# Patient Record
Sex: Female | Born: 1985 | Race: White | Hispanic: No | Marital: Single | Smoking: Current every day smoker
Health system: Southern US, Community
[De-identification: ages and names within clinical notes are randomized; demographics above are authoritative.]

## PROBLEM LIST (undated history)

## (undated) DIAGNOSIS — E669 Obesity, unspecified: Secondary | ICD-10-CM

## (undated) DIAGNOSIS — F329 Major depressive disorder, single episode, unspecified: Secondary | ICD-10-CM

## (undated) DIAGNOSIS — M1990 Unspecified osteoarthritis, unspecified site: Secondary | ICD-10-CM

## (undated) DIAGNOSIS — C569 Malignant neoplasm of unspecified ovary: Secondary | ICD-10-CM

## (undated) DIAGNOSIS — F319 Bipolar disorder, unspecified: Secondary | ICD-10-CM

## (undated) DIAGNOSIS — E282 Polycystic ovarian syndrome: Secondary | ICD-10-CM

## (undated) DIAGNOSIS — K219 Gastro-esophageal reflux disease without esophagitis: Secondary | ICD-10-CM

## (undated) DIAGNOSIS — F32A Depression, unspecified: Secondary | ICD-10-CM

## (undated) HISTORY — DX: Major depressive disorder, single episode, unspecified: F32.9

## (undated) HISTORY — DX: Obesity, unspecified: E66.9

## (undated) HISTORY — DX: Depression, unspecified: F32.A

## (undated) HISTORY — DX: Gastro-esophageal reflux disease without esophagitis: K21.9

## (undated) HISTORY — DX: Unspecified osteoarthritis, unspecified site: M19.90

---
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
## 2007-01-01 ENCOUNTER — Emergency Department (HOSPITAL_COMMUNITY): Admission: EM | Admit: 2007-01-01 | Discharge: 2007-01-01 | Payer: Self-pay | Admitting: Emergency Medicine

## 2007-05-15 DIAGNOSIS — F32A Depression, unspecified: Secondary | ICD-10-CM | POA: Insufficient documentation

## 2007-12-13 HISTORY — PX: ANTERIOR CRUCIATE LIGAMENT REPAIR: SHX115

## 2008-10-10 ENCOUNTER — Other Ambulatory Visit: Admission: RE | Admit: 2008-10-10 | Discharge: 2008-10-10 | Payer: Self-pay | Admitting: Obstetrics and Gynecology

## 2008-12-30 ENCOUNTER — Encounter: Admission: RE | Admit: 2008-12-30 | Discharge: 2009-03-24 | Payer: Self-pay | Admitting: Specialist

## 2009-07-04 ENCOUNTER — Emergency Department (HOSPITAL_COMMUNITY): Admission: EM | Admit: 2009-07-04 | Discharge: 2009-07-04 | Payer: Self-pay | Admitting: Emergency Medicine

## 2009-12-30 ENCOUNTER — Other Ambulatory Visit: Admission: RE | Admit: 2009-12-30 | Discharge: 2009-12-30 | Payer: Self-pay | Admitting: Obstetrics and Gynecology

## 2010-07-01 ENCOUNTER — Other Ambulatory Visit: Admission: RE | Admit: 2010-07-01 | Discharge: 2010-07-01 | Payer: Self-pay | Admitting: Obstetrics and Gynecology

## 2011-04-29 NOTE — H&P (Signed)
NAMESHRINIKA, BLATZ              ACCOUNT NO.:  [id number]   MEDICAL RECORD NO.:  [id number]          PATIENT TYPE:  INP   LOCATION:  LDR1                          FACILITY:  [facility]   PHYSICIAN:  [name], M.D. DATE OF BIRTH:  July 09, 1986   DATE OF ADMISSION:  12/07/2005  DATE OF DISCHARGE:  LH                                HISTORY & PHYSICAL   REASON FOR ADMISSION:  Pregnancy at 40 weeks and 5 days for induction of  labor.   HISTORY OF PRESENT ILLNESS:  Veronique is a 25 year old gravida 1, para 0,  EDC is 12/02/2005 who is in today for her prenatal exam and to discuss  induction of labor.  After careful discussion with risks and benefits, she  is in agreement to proceed with induction.   MEDICAL HISTORY:  Positive for migraines and psoriasis.   SURGICAL HISTORY:  Negative.   ALLERGIES:  She has no known allergies.   MEDICATIONS:  She is taking prenatal vitamins.   SOCIAL HISTORY:  The boyfriend is present and supportive.   FAMILY HISTORY:  Positive for breast cancer, hypertension, and diabetes.   PRENATAL COURSE:  Essentially uneventful.  Blood type is A positive, UDS  negative.  Rubella immune.  Hepatitis B surface antigen is negative.  HIV is  negative.  HSV type 1 positive, type 2 negative.  Serology is nonreactive on  both draws.  Pap is normal.  GC and Chlamydia are negative.  Repeat GC and  Chlamydia are also negative.  AFP normal.  Current hemoglobin 11.12, current  hematocrit 36.6.  One-hour glucose was 79, GBS is negative.   PHYSICAL EXAMINATION:  VITAL SIGNS:  Today, weight is 265, blood pressure  124/78.  Urine is clear.  ABDOMEN:  There is good fetal movement noted.  Fundal height is 40 cm.  Fetal heart rate is 150 strong and  regular.  PELVIC:  Cervix is 1-2, 70, minus 2 station.   PLAN:  We are going to admit for dilation and induction of labor.      Zerita Boers, Lanier Clam      [name], M.D.  Electronically Signed    DL/MEDQ  D:   14/78/2956  T:  12/07/2005  Job:  [id number]   cc:   Francoise Schaumann. Milford Cage DO, FAAP  Fax: [phone]

## 2011-04-29 NOTE — Op Note (Signed)
NAMENAVY, ROTHSCHILD              ACCOUNT NO.:  [id number]   MEDICAL RECORD NO.:  [id number]          PATIENT TYPE:  INP   LOCATION:  A412                          FACILITY:  [facility]   PHYSICIAN:  [name], M.D. DATE OF BIRTH:  1986-02-28   DATE OF PROCEDURE:  12/08/2005  DATE OF DISCHARGE:                                 OPERATIVE REPORT   PROCEDURE:  Epidural catheter placement, 2:30 p.m.   SURGEON:  [name], M.D.   DESCRIPTION OF PROCEDURE:  Continuous lumbar epidural catheter placed using  a loss of resistance technique at L3-4 interspace.  The first two attempts  were slightly off midline due to the patient's significant abdominal girth  and edematous tissues in the back precluding easy identification of bony  interspaces.  The patient had the characteristic loss of resistance  sensation at 8 cm depth, at which time the 5 mL test dose of 1.5% lidocaine  with epinephrine was instilled followed by insertion of the catheter 2-3 cm  into the epidural space and taping to the back.  The 9 mL bolus followed.  The patient had symmetric analgesic effect at T10 with the patient having no  blood pressure changes or changes in the fetal status.  The patient will be  maintained on 12 mL per hour and at 4 p.m. is reported as being 6 cm dilated  with the vertex descending nicely.      [name], M.D.  Electronically Signed     JVF/MEDQ  D:  12/08/2005  T:  12/09/2005  Job:  [id number]

## 2011-04-29 NOTE — Consult Note (Signed)
NAMEJAIDENCE, GEISLER NO.:  [id number]   MEDICAL RECORD NO.:  [id number]          PATIENT TYPE:  INP   LOCATION:  LDR1                          FACILITY:  [facility]   PHYSICIAN:  [name], M.D. DATE OF BIRTH:  [birth year]   DATE OF CONSULTATION:  12/08/2005  DATE OF DISCHARGE:                                   CONSULTATION   DELIVERY NOTE   I came to labor and delivery where Kaneshia had just been examined and  noted to be 10 cm and crowning.  She pushed about 3-4 times and had a  spontaneous vaginal delivery of a viable female infant at 1818 hours.  The  shoulders delivered without difficult.  Weight is 8 pounds 6 ounces.  Apgar's are 9 and 9.  She had 20 units of Pitocin diluted in 1000 mL of  lactated Ringer's was infused rapidly IV.  The placenta separated  spontaneously and delivered via controlled cord contraction at 1823 hours.  It was inspected and appears to be intact with a succenturiate.  The lochia  was normal.  The fundus was firm.  The vagina was then inspected and no  lacerations were found.  Estimated blood loss 200 mL.  The epidural catheter  was then removed with the blue-tip visualized as being intact.      Jacklyn Shell, C.N.M.      [name], M.D.  Electronically Signed    FC/MEDQ  D:  12/08/2005  T:  12/08/2005  Job:  [id number]   cc:   Jeoffrey Massed, MD  Fax: [phone]   [facility]

## 2011-08-03 ENCOUNTER — Other Ambulatory Visit (HOSPITAL_COMMUNITY)
Admission: RE | Admit: 2011-08-03 | Discharge: 2011-08-03 | Disposition: A | Discharge status: Home / Self Care | Payer: No Typology Code available for payment source | Source: Ambulatory Visit | Attending: Obstetrics and Gynecology | Admitting: Obstetrics and Gynecology

## 2011-08-03 ENCOUNTER — Other Ambulatory Visit: Payer: Self-pay | Admitting: Obstetrics and Gynecology

## 2011-08-03 DIAGNOSIS — R87810 Cervical high risk human papillomavirus (HPV) DNA test positive: Secondary | ICD-10-CM | POA: Insufficient documentation

## 2011-08-03 DIAGNOSIS — Z124 Encounter for screening for malignant neoplasm of cervix: Secondary | ICD-10-CM | POA: Insufficient documentation

## 2011-08-03 DIAGNOSIS — Z113 Encounter for screening for infections with a predominantly sexual mode of transmission: Secondary | ICD-10-CM | POA: Insufficient documentation

## 2011-08-08 ENCOUNTER — Ambulatory Visit: Payer: Self-pay | Admitting: Internal Medicine

## 2011-09-08 ENCOUNTER — Ambulatory Visit (INDEPENDENT_AMBULATORY_CARE_PROVIDER_SITE_OTHER)
Admission: RE | Admit: 2011-09-08 | Discharge: 2011-09-08 | Disposition: A | Discharge status: Home / Self Care | Payer: No Typology Code available for payment source | Source: Ambulatory Visit | Attending: Internal Medicine | Admitting: Internal Medicine

## 2011-09-08 ENCOUNTER — Other Ambulatory Visit: Payer: Self-pay | Admitting: Internal Medicine

## 2011-09-08 ENCOUNTER — Ambulatory Visit (INDEPENDENT_AMBULATORY_CARE_PROVIDER_SITE_OTHER): Payer: No Typology Code available for payment source | Admitting: Internal Medicine

## 2011-09-08 ENCOUNTER — Encounter: Payer: Self-pay | Admitting: Internal Medicine

## 2011-09-08 ENCOUNTER — Other Ambulatory Visit (INDEPENDENT_AMBULATORY_CARE_PROVIDER_SITE_OTHER): Payer: No Typology Code available for payment source

## 2011-09-08 DIAGNOSIS — R110 Nausea: Secondary | ICD-10-CM | POA: Insufficient documentation

## 2011-09-08 DIAGNOSIS — M549 Dorsalgia, unspecified: Secondary | ICD-10-CM

## 2011-09-08 DIAGNOSIS — G8929 Other chronic pain: Secondary | ICD-10-CM

## 2011-09-08 DIAGNOSIS — R42 Dizziness and giddiness: Secondary | ICD-10-CM

## 2011-09-08 DIAGNOSIS — F17200 Nicotine dependence, unspecified, uncomplicated: Secondary | ICD-10-CM

## 2011-09-08 DIAGNOSIS — E663 Overweight: Secondary | ICD-10-CM | POA: Insufficient documentation

## 2011-09-08 LAB — URINALYSIS, ROUTINE W REFLEX MICROSCOPIC
Specific Gravity, Urine: >=1.03 (ref 1.000–1.030)
pH: 5.5 (ref 5.0–8.0)

## 2011-09-08 LAB — CBC WITH DIFFERENTIAL/PLATELET
Basophils Relative: 0.4 % (ref 0.0–3.0)
Lymphocytes Relative: 26.9 % (ref 12.0–46.0)
Lymphs Abs: 3.1 10*3/uL (ref 0.7–4.0)
Monocytes Absolute: 1 10*3/uL (ref 0.1–1.0)
Monocytes Relative: 8.9 % (ref 3.0–12.0)
Neutro Abs: 7 10*3/uL (ref 1.4–7.7)
Neutrophils Relative %: 61.4 % (ref 43.0–77.0)
Platelets: 303 10*3/uL (ref 150.0–400.0)
RDW: 13.2 % (ref 11.5–14.6)

## 2011-09-08 LAB — POCT URINE PREGNANCY: Preg Test, Ur: NEGATIVE

## 2011-09-08 MED ORDER — CYCLOBENZAPRINE HCL 5 MG PO TABS: 5.0000 mg | ORAL_TABLET | Freq: Three times a day (TID) | ORAL | Status: DC | PRN

## 2011-09-08 MED ORDER — MELOXICAM 15 MG PO TABS: 15.0000 mg | ORAL_TABLET | Freq: Every day | ORAL | Status: DC

## 2011-09-08 NOTE — Progress Notes (Signed)
Subjective:    Patient ID: Jacqueline Morrow, female    DOB: 1986-11-16, 25 y.o.   MRN: [id number]  HPI New pt to me and our practice, here to establish care  complains of low back pain Onset >12 months ago Pain is intermittent, wax/wane intensity Denies precipitating overuse, injury or trauma, no falls or MVA No radiation of pain into flank, legs or up spine No weight changes - 250s# since 2006 birth of son (prev in 210s#) No dysuria, no hx kidney stones Pain not improved with OTC NSAIDs, tylenol or heat Pain currently 3/10, at worst 9/10 ?hx scoliosis as teenager in HS  Also complains of nausea symptoms worst when hungry symptoms improved with food Problem occurs daily for last 6 months associated with sweating and weakness sensation, head "foggy" and tremulous No known DM or gestational DM Not associated with abdominal pain, vomiting or bowel change  Past Medical History  Diagnosis Date  . Depression   . Arthritis   . GERD (gastroesophageal reflux disease)   . Allergic rhinitis   . Migraine   . Vaginal delivery 2006  . Obese    Family History  Problem Relation Age of Onset  . Arthritis Other     both parents & grandparents  . Heart disease Other     grandparents  . Diabetes Paternal Aunt   . Hyperlipidemia Other     grandparents  . Hypertension Maternal Grandmother    History  Substance Use Topics  . Smoking status: Current Everyday Smoker -- 1.0 packs/day    Types: Cigarettes  . Smokeless tobacco: Never Used  . Alcohol Use: 0.5 oz/week    1 drink(s) per week    Review of Systems Constitutional: Negative for fever or weight changes.  Respiratory: Negative for cough and shortness of breath.   Cardiovascular: Negative for chest pain.  Gastrointestinal: Negative for abdominal pain.  Musculoskeletal: Negative for gait problem.  Skin: Negative for rash.  Neurological: Negative for dizziness.  No other specific complaints in a complete review of systems  (except as listed in HPI above).     Objective:   Physical Exam BP 120/86  Pulse 70  Temp(Src) 98 F (36.7 C) (Oral)  Ht 5\' 9"  (1.753 m)  Wt 269 lb 6.4 oz (122.199 kg)  BMI 39.78 kg/m2  SpO2 97% Constitutional: She is overweight; She appears well-developed and well-nourished. No distress.  HENT: Head: Normocephalic and atraumatic. Ears: B TMs ok, no erythema or effusion; Nose: Nose normal.  Mouth/Throat: poor dentition; Oropharynx is red but clear and moist. No oropharyngeal exudate.  Eyes: Conjunctivae and EOM are normal. Pupils are equal, round, and reactive to light. No scleral icterus.  Neck: Thick; Normal range of motion. Neck supple. No JVD present. No thyromegaly present.  Cardiovascular: Normal rate, regular rhythm and normal heart sounds.  No murmur heard. No BLE edema. Pulmonary/Chest: Effort normal and breath sounds normal. No respiratory distress. She has no wheezes.  Abdominal: Obese, Soft. Bowel sounds are normal. She exhibits no distension. There is no tenderness. no masses Musculoskeletal: Back: full range of motion of thoracic and lumbar spine. Non tender to palpation. Negative straight leg raise. DTR's are symmetrically intact. Sensation intact in all dermatomes of the lower extremities. Full strength to manual muscle testing - patient is able to heel toe walk without difficulty and ambulates with normal gait. Neurological: She is alert and oriented to person, place, and time. No cranial nerve deficit. Coordination normal.  Skin: Skin is warm and dry. No  rash noted. No erythema.  Psychiatric: She has a normal mood and affect. Her behavior is normal. Judgment and thought content normal.   No results found for this basename: WBC, HGB, HCT, PLT, CHOL, TRIG, HDL, LDLDIRECT, ALT, AST, NA, K, CL, CREATININE, BUN, CO2, TSH, PSA, INR, GLUF, HGBA1C, MICROALBUR       Assessment & Plan:  See problem list. Medications and labs reviewed today.

## 2011-09-08 NOTE — Assessment & Plan Note (Signed)
5 minutes today spent counseling patient on unhealthy effects of continued tobacco abuse and encouragement of cessation including medical options available to help the patient quit smoking. 

## 2011-09-08 NOTE — Assessment & Plan Note (Signed)
?  reactive hypoglycemia or gastroparesis Exam benign - check screening labs Consider nutrition or GI eval if persisitng symptoms without other findings

## 2011-09-08 NOTE — Patient Instructions (Addendum)
It was good to see you today. We have reviewed your prior records including labs and tests today Test(s) ordered today - blood and xray. Your results will be called to you after review (48-72hours after test completion). If any changes need to be made, you will be notified at that time. Depending on these results, we will prescribe medications for your back pain - also consider physical therapy Work on lifestyle changes as discussed (low fat, low carb, increased protein diet; improved exercise efforts; weight loss) to control sugar, blood pressure and cholesterol levels and/or reduce risk of developing other medical problems. Look into [employer]cy or other type of food journal to assist you in this process. Stop smoking - this is very bad for you and your loved ones health! Back Exercises Back exercises help treat and prevent back injuries. The goal of back exercises is to increase the strength of your abdominal and back muscles and the flexibility of your back. These exercises should be started when you no longer have back pain. Back exercises include: 1. Pelvic Tilt - Lie on your back with your knees bent. Tilt your pelvis until the lower part of your back is against the floor. Hold this position 5-10 sec and repeat 5-10 times.  2. Knee to Chest - Pull first one knee up against your chest and hold for 20-30 seconds, repeat this with the other knee, and then both knees. This may be done with the other leg straight or bent, whichever feels better.  3. Sit-Ups or Curl-Ups - Bend your knees 90 degrees. Start with tilting your pelvis, and do a partial, slow sit-up, lifting your trunk only 30-45 degrees off the floor. Take at least 2-3 sec for each sit-up. Do not do sit-ups with your knees out straight. If partial sit-ups are difficult, simply do the above but with only tightening your abdominal muscles and holding it as directed.  4. Hip-Lift - Lie on your back with your knees flexed 90 degrees. Push  down with your feet and shoulders as you raise your hips a couple inches off the floor; hold for 10 sec, repeat 5-10 times.  5. Back arches - Lie on your stomach, propping yourself up on bent elbows. Slowly press on your hands, causing an arch in your low back. Repeat 3-5 times. Any initial stiffness and discomfort should lessen with repetition over time.  6. Shoulder-Lifts - Lie face down with arms beside your body. Keep hips and torso pressed to floor as you slowly lift your head and shoulders off the floor.  Do not overdo your exercises, especially in the beginning. Exercises may cause you some mild back discomfort which lasts for a few minutes; however, if the pain is more severe, or lasts for more than 15 minutes, do not continue exercises until you see your caregiver. Improvement with exercise therapy for back problems is slow.   See your caregivers for assistance with developing a proper back exercise program. Document Released: 01/05/2005 Document Re-Released: 02/24/2009 [facility] Patient Information 2011 [place], [place].

## 2011-09-08 NOTE — Assessment & Plan Note (Signed)
Wt Readings from Last 3 Encounters:  09/08/11 269 lb 6.4 oz (122.199 kg)   Weight is primary obstacle to health at this time Reviewed potential health risks including diabetes mellitus, dyslipidemia, HTN and osteoarthritis  Encouraged healthy lifestyle changes to include increase aerobic activity and heart healthy choices

## 2011-09-08 NOTE — Assessment & Plan Note (Signed)
No neuro deficits or Mskel abnormalities on exam Suspect strain from obesity and deconditioning Check plain films due to ?of scoliosis but not readily appreciable on exam -  Explained risk of premature DDD and DJD related to obesity If xray without significant findings, plan NSAIDs daily and muscle relaxers prn Back exercises for core strengthening and reassurance provided

## 2011-09-09 ENCOUNTER — Other Ambulatory Visit: Payer: Self-pay | Admitting: Internal Medicine

## 2011-09-09 DIAGNOSIS — R1084 Generalized abdominal pain: Secondary | ICD-10-CM

## 2011-09-09 DIAGNOSIS — R110 Nausea: Secondary | ICD-10-CM

## 2011-09-09 LAB — BASIC METABOLIC PANEL
Calcium: 9.1 mg/dL (ref 8.4–10.5)
Chloride: 110 mEq/L (ref 96–112)
Glucose, Bld: 71 mg/dL (ref 70–99)

## 2011-09-09 LAB — HEPATIC FUNCTION PANEL
ALT: 59 U/L — ABNORMAL HIGH (ref 0–35)
Albumin: 4 g/dL (ref 3.5–5.2)
Alkaline Phosphatase: 60 U/L (ref 39–117)
Total Bilirubin: 0.4 mg/dL (ref 0.3–1.2)
Total Protein: 7.5 g/dL (ref 6.0–8.3)

## 2011-09-09 LAB — TSH: TSH: 1.23 u[IU]/mL (ref 0.35–5.50)

## 2011-09-15 ENCOUNTER — Other Ambulatory Visit: Payer: Self-pay | Admitting: Internal Medicine

## 2011-09-15 ENCOUNTER — Ambulatory Visit
Admission: RE | Admit: 2011-09-15 | Discharge: 2011-09-15 | Disposition: A | Discharge status: Home / Self Care | Payer: No Typology Code available for payment source | Source: Ambulatory Visit | Attending: Internal Medicine | Admitting: Internal Medicine

## 2011-09-15 DIAGNOSIS — K8020 Calculus of gallbladder without cholecystitis without obstruction: Secondary | ICD-10-CM | POA: Insufficient documentation

## 2011-09-15 DIAGNOSIS — R110 Nausea: Secondary | ICD-10-CM

## 2011-09-15 DIAGNOSIS — R1084 Generalized abdominal pain: Secondary | ICD-10-CM

## 2011-10-05 ENCOUNTER — Other Ambulatory Visit (HOSPITAL_COMMUNITY): Payer: No Typology Code available for payment source

## 2011-10-05 ENCOUNTER — Inpatient Hospital Stay (HOSPITAL_COMMUNITY): Admission: RE | Admit: 2011-10-05 | Payer: No Typology Code available for payment source | Source: Ambulatory Visit

## 2011-10-17 ENCOUNTER — Other Ambulatory Visit: Payer: Self-pay | Admitting: Obstetrics and Gynecology

## 2011-10-17 NOTE — H&P (Signed)
Jacqueline Morrow is an 25 y.o. female.  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 Previous biopsies in 2011 had only identified and mild dysplasia. But August 2012 Pap smear showed progression of disease   patient desires future childbearing to Pertinent Gynecological History: Menses: flow is moderate and regular every month without intermenstrual spotting Bleeding:  normal Contraception: none DES exposure: denies Blood transfusions: none Sexually transmitted diseases: no past history Previous GYN Procedures: colposcopy with biopsies 2011 Last mammogram:  not applicable Date:  Last pap: abnormal:  high-grade lesion Date:  August 2012 OB History: G1, P1   Menstrual History: Menarche age:  No LMP recorded.    Past Medical History  Diagnosis Date  . Depression   . Arthritis   . GERD (gastroesophageal reflux disease)   . Allergic rhinitis   . Migraine   . Vaginal delivery 2006  . Obese     Past Surgical History  Procedure Date  . Anterior cruciate ligament repair 2009    Family History  Problem Relation Age of Onset  . Arthritis Other     both parents & grandparents  . Heart disease Other     grandparents  . Diabetes Paternal Aunt   . Hyperlipidemia Other     grandparents  . Hypertension Maternal Grandmother     Social History:  reports that she has been smoking Cigarettes.  She has been smoking about 1 pack per day. She has never used smokeless tobacco. She reports that she drinks about .5 ounces of alcohol per week. She reports that she does not use illicit drugs.  Allergies: No Known Allergies   (Not in a hospital admission)  ROS  Negative except for history of present illness  There were no vitals taken for this visit. Physical ExamPhysical Examination: General appearance -  alert, well appearing, and in no distress and oriented to person, place, and time Mouth - mucous membranes moist, pharynx normal without lesions and dental hygiene good Chest - clear to auscultation, no wheezes, rales or rhonchi, symmetric air entry Heart - normal rate and regular rhythm Abdomen - soft, nontender, nondistended, no masses or organomegaly Pelvic - VULVA: normal appearing vulva with no masses, tenderness or lesions, VAGINA: normal appearing vagina with normal color and discharge, no lesions, CERVIX: normal appearing cervix without discharge or lesions, ectropion   cervix with extensive  Dysplasia of suspected grade 2 UTERUS: uterus is normal size, shape, consistency and nontender  No results found for this or any previous visit (from the past 24 hour(s)).  No results found.  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

## 2011-10-18 ENCOUNTER — Encounter (HOSPITAL_COMMUNITY)
Admission: RE | Admit: 2011-10-18 | Discharge: 2011-10-18 | Payer: No Typology Code available for payment source | Source: Ambulatory Visit | Attending: Internal Medicine | Admitting: Internal Medicine

## 2011-10-18 NOTE — Patient Instructions (Addendum)
20 Jacqueline Morrow  10/18/2011   Your procedure is scheduled on:  10/25/2011   Report to [facility] at 10:00 AM.  Call this number if you have problems the morning of surgery: [phone]   Remember:   Do not eat food:After Midnight.  Do not drink clear liquids: After Midnight.  Take these medicines the morning of surgery with A SIP OF WATER: Flexeril, Mobic   Do not wear jewelry, make-up or nail polish.  Do not wear lotions, powders, or perfumes. You may wear deodorant.  Do not shave 48 hours prior to surgery.  Do not bring valuables to the hospital.  Contacts, dentures or bridgework may not be worn into surgery.  Leave suitcase in the car. After surgery it may be brought to your room.  For patients admitted to the hospital, checkout time is 11:00 AM the day of discharge.   Patients discharged the day of surgery will not be allowed to drive home.  Name and phone number of your driver:   Special Instructions: CHG Shower Use Special Wash: 1/2 bottle night before surgery and 1/2 bottle morning of surgery.   Please read over the following fact sheets that you were given: Pain Booklet, MRSA Information, Surgical Site Infection Prevention, Anesthesia Post-op Instructions and Care and Recovery After Surgery      Conization of the Cervix Conization is the cutting (excision) of a cone-shaped portion of the cervix. This procedure is usually done when there is abnormal bleeding from the cervix. It can also be done to evaluate an abnormal Pap smear or if an abnormality is seen on the cervix during an exam. Conization of the cervix is not done during a menstrual period or pregnancy. BEFORE THE PROCEDURE  Do not eat or drink anything for 6 to 8 hours before the procedure, especially if you are going to be given a drug to make you sleep (general anesthetic).   Do not take aspirin or blood thinners for at least a week before the procedure, or as directed.   Arrive at least an hour before the  procedure to read and sign the necessary forms.   Arrange for someone to take you home after the procedure.   If you smoke, do not smoke for 2 weeks before the procedure.  LET YOUR CAREGIVER KNOW THE FOLLOWING:  Allergies to food or medications.   All the medications you are taking including over-the-counter and prescription medications, herbs, eyedrops, and creams.   You develop a cold or an infection.   If you are using illegal drugs or drinking too much alcohol.   Your smoking habits.   Previous problems with anesthetics including novocaine.   The possibility of being pregnant.   History of blood clots or other bleeding problems.   Other medical problems.  RISKS AND COMPLICATIONS   Bleeding.   Infection.   Damage to the cervix.   Injury to surrounding organs.   Problems with the anesthesia.  PROCEDURE Conization of the cervix can be done by:  Cold knife. This type cuts out the cervical canal and the transformation zone (where the normal cells end and the abnormal cells begin) with a scalpel.   LEEP (electrocautery). This type is done with a thin wire that can cut and burn (cauterize) the cervical tissue with an electrical current.   Laser treatment. This type cuts and burns (cauterizes) the tissue of the cervix to prevent bleeding with a laser beam.  You will be given a drug to make you  sleep (general anesthetic) for cold knife and laser treatments, and you will be given a numbing medication (local anesthetic) for the LEEP procedure. Conization is usually done using colposcopy. Colposcopy magnifies the cervix to see it more clearly. The tissue removed is examined under a microscope by a doctor Psychologist, educational). The pathologist will provide a report to your caregiver. This will help your caregiver decide if further treatment is necessary. This report will also help your caregiver decide on the best treatment if your results are abnormal.  AFTER THE PROCEDURE  If you had  a general anesthetic, you may be groggy for a couple of hours after the procedure.   If you had a local anesthetic, you will be advised to rest at the surgical center or caregiver's office until you are stable and feel ready to go home.   Have someone take you home.   You may have some cramping for a couple of days.   You may have a bloody discharge or light bleeding for 2 to 4 weeks.   You may have a black discharge coming from the vagina. This is from the paste used on the cervix to prevent bleeding. This is normal discharge.  HOME CARE INSTRUCTIONS   Do not drive for 24 hours.   Avoid strenuous activities and exercises for at least 7 - 10 days.   Only take over-the-counter or prescription medicines for pain, discomfort, or fever as directed by your caregiver.   Do not take aspirin. It can cause or aggravate bleeding.   You may resume your usual diet.   Drink 6 to 8 glasses of fluid a day.   Rest and sleep the first 24 to 48 hours.   Take showers instead of baths until your caregiver gives you the okay.   Do not use tampons, douche or have intercourse for 4 weeks, or as advised by your caregiver.   Do not lift anything over 10 pounds (4.5 kg) for at least 7 to 10 days, or as advised by your caregiver.   Take your temperature twice a day, for 4 to 5 days. Write them down.   Do not drink or drive when taking pain medication.   If you develop constipation:   Take a mild laxative with the advice of your caregiver.   Eat bran foods.   Drink a lot of fluids.   Try to have someone with you or available for you the first 24 to 48 hours, especially if you had a general anesthetic.   Make sure you and your family understand everything about your surgery and recovery.   Follow your caregiver's advice regarding follow-up appointments and Pap smears.  SEEK MEDICAL CARE IF:   You develop a rash.   You are dizzy or light-headed.   You feel sick to your stomach (nauseous).     You develop a bad smelling vaginal discharge.   You have an abnormal reaction or allergy to your medication.   You need stronger pain medication.  SEEK IMMEDIATE MEDICAL CARE IF:   You have blood clots or bleeding that is heavier than a normal menstrual period, or you develop bright red bleeding.   An oral temperature above 102 F (38.9 C) develops and persists for several hours.   You have increasing cramps.   You pass out.   You have painful or bloody urine.   You start throwing up (vomiting).   The pain is not relieved with your pain medication.  Not all test results  are available during your visit. If your test results are not back during your the visit, make an appointment with your caregiver to find out the results. Do not assume everything is normal if you have not heard from your caregiver or the medical facility. It is important for you to follow up on all of your test results. Document Released: 09/07/2005 Document Revised: 08/10/2011 Document Reviewed: 06/29/2009 [facility] Patient Information 2012 [place], [place].     PATIENT INSTRUCTIONS POST-ANESTHESIA  IMMEDIATELY FOLLOWING SURGERY:  Do not drive or operate machinery for the first twenty four hours after surgery.  Do not make any important decisions for twenty four hours after surgery or while taking narcotic pain medications or sedatives.  If you develop intractable nausea and vomiting or a severe headache please notify your doctor immediately.  FOLLOW-UP:  Please make an appointment with your surgeon as instructed. You do not need to follow up with anesthesia unless specifically instructed to do so.  WOUND CARE INSTRUCTIONS (if applicable):  Keep a dry clean dressing on the anesthesia/puncture wound site if there is drainage.  Once the wound has quit draining you may leave it open to air.  Generally you should leave the bandage intact for twenty four hours unless there is drainage.  If the epidural site drains  for more than 36-48 hours please call the anesthesia department.  QUESTIONS?:  Please feel free to call your physician or the hospital operator if you have any questions, and they will be happy to assist you.     [facility] Anesthesia Department [address] [place] [phone]

## 2011-10-24 ENCOUNTER — Encounter (HOSPITAL_COMMUNITY): Admission: RE | Admit: 2011-10-24 | Payer: No Typology Code available for payment source | Source: Ambulatory Visit

## 2011-10-25 ENCOUNTER — Ambulatory Visit (HOSPITAL_COMMUNITY)
Admission: RE | Admit: 2011-10-25 | Payer: No Typology Code available for payment source | Source: Ambulatory Visit | Admitting: Obstetrics and Gynecology

## 2011-10-25 ENCOUNTER — Encounter (HOSPITAL_COMMUNITY): Admission: RE | Payer: Self-pay | Source: Ambulatory Visit

## 2011-10-25 SURGERY — CONE BIOPSY, CERVIX
Anesthesia: General

## 2011-11-02 ENCOUNTER — Encounter (HOSPITAL_COMMUNITY): Payer: Self-pay

## 2011-11-02 ENCOUNTER — Encounter (HOSPITAL_COMMUNITY): Payer: Self-pay | Admitting: Pharmacy Technician

## 2011-11-02 ENCOUNTER — Encounter (HOSPITAL_COMMUNITY)
Admission: RE | Admit: 2011-11-02 | Discharge: 2011-11-02 | Disposition: A | Discharge status: Home / Self Care | Payer: No Typology Code available for payment source | Source: Ambulatory Visit | Attending: Obstetrics and Gynecology | Admitting: Obstetrics and Gynecology

## 2011-11-02 LAB — CBC
MCH: 30.5 pg (ref 26.0–34.0)
Platelets: 254 10*3/uL (ref 150–400)
RBC: 4.88 MIL/uL (ref 3.87–5.11)
WBC: 11 10*3/uL — ABNORMAL HIGH (ref 4.0–10.5)

## 2011-11-02 LAB — URINALYSIS, ROUTINE W REFLEX MICROSCOPIC
Bilirubin Urine: NEGATIVE
Nitrite: NEGATIVE
Specific Gravity, Urine: >1.03 — ABNORMAL HIGH (ref 1.005–1.030)
Urobilinogen, UA: 0.2 mg/dL (ref 0.0–1.0)

## 2011-11-02 LAB — SURGICAL PCR SCREEN: Staphylococcus aureus: NEGATIVE

## 2011-11-02 LAB — BASIC METABOLIC PANEL
BUN: 9 mg/dL (ref 6–23)
Chloride: 107 mEq/L (ref 96–112)
Creatinine, Ser: 0.79 mg/dL (ref 0.50–1.10)
GFR calc Af Amer: >90 mL/min (ref >90)
GFR calc non Af Amer: >90 mL/min (ref >90)
Glucose, Bld: 101 mg/dL — ABNORMAL HIGH (ref 70–99)

## 2011-11-02 LAB — HCG, SERUM, QUALITATIVE: Preg, Serum: NEGATIVE

## 2011-11-02 NOTE — Patient Instructions (Addendum)
21 Jacqueline Morrow  11/02/2011   Your procedure is scheduled on:  11/08/2011  Report to [facility] at 08:00 AM.  Call this number if you have problems the morning of surgery: [phone]   Remember:   Do not eat food:After Midnight.  Do not drink clear liquids: After Midnight.  Take these medicines the morning of surgery with A SIP OF WATER: Flexeril   Do not wear jewelry, make-up or nail polish.  Do not wear lotions, powders, or perfumes. You may wear deodorant.  Do not shave 48 hours prior to surgery.  Do not bring valuables to the hospital.  Contacts, dentures or bridgework may not be worn into surgery.  Leave suitcase in the car. After surgery it may be brought to your room.  For patients admitted to the hospital, checkout time is 11:00 AM the day of discharge.   Patients discharged the day of surgery will not be allowed to drive home.  Name and phone number of your driver: boyfriend, Weston Brass  Special Instructions: CHG Bath-1/2 bottle the night before surgery and 1/2 bottle the morning of surgery   Please read over the following fact sheets that you were given: Pain Booklet, MRSA Information, Surgical Site Infection Prevention, Anesthesia Post-op Instructions and Care and Recovery After Surgery      Conization of the Cervix Conization is the cutting (excision) of a cone-shaped portion of the cervix. This procedure is usually done when there is abnormal bleeding from the cervix. It can also be done to evaluate an abnormal Pap smear or if an abnormality is seen on the cervix during an exam. Conization of the cervix is not done during a menstrual period or pregnancy. BEFORE THE PROCEDURE  Do not eat or drink anything for 6 to 8 hours before the procedure, especially if you are going to be given a drug to make you sleep (general anesthetic).   Do not take aspirin or blood thinners for at least a week before the procedure, or as directed.   Arrive at least an hour before the procedure  to read and sign the necessary forms.   Arrange for someone to take you home after the procedure.   If you smoke, do not smoke for 2 weeks before the procedure.  LET YOUR CAREGIVER KNOW THE FOLLOWING:  Allergies to food or medications.   All the medications you are taking including over-the-counter and prescription medications, herbs, eyedrops, and creams.   You develop a cold or an infection.   If you are using illegal drugs or drinking too much alcohol.   Your smoking habits.   Previous problems with anesthetics including novocaine.   The possibility of being pregnant.   History of blood clots or other bleeding problems.   Other medical problems.  RISKS AND COMPLICATIONS   Bleeding.   Infection.   Damage to the cervix.   Injury to surrounding organs.   Problems with the anesthesia.  PROCEDURE Conization of the cervix can be done by:  Cold knife. This type cuts out the cervical canal and the transformation zone (where the normal cells end and the abnormal cells begin) with a scalpel.   LEEP (electrocautery). This type is done with a thin wire that can cut and burn (cauterize) the cervical tissue with an electrical current.   Laser treatment. This type cuts and burns (cauterizes) the tissue of the cervix to prevent bleeding with a laser beam.  You will be given a drug to make you sleep (general anesthetic)  for cold knife and laser treatments, and you will be given a numbing medication (local anesthetic) for the LEEP procedure. Conization is usually done using colposcopy. Colposcopy magnifies the cervix to see it more clearly. The tissue removed is examined under a microscope by a doctor Psychologist, educational). The pathologist will provide a report to your caregiver. This will help your caregiver decide if further treatment is necessary. This report will also help your caregiver decide on the best treatment if your results are abnormal.  AFTER THE PROCEDURE  If you had a general  anesthetic, you may be groggy for a couple of hours after the procedure.   If you had a local anesthetic, you will be advised to rest at the surgical center or caregiver's office until you are stable and feel ready to go home.   Have someone take you home.   You may have some cramping for a couple of days.   You may have a bloody discharge or light bleeding for 2 to 4 weeks.   You may have a black discharge coming from the vagina. This is from the paste used on the cervix to prevent bleeding. This is normal discharge.  HOME CARE INSTRUCTIONS   Do not drive for 24 hours.   Avoid strenuous activities and exercises for at least 7 - 10 days.   Only take over-the-counter or prescription medicines for pain, discomfort, or fever as directed by your caregiver.   Do not take aspirin. It can cause or aggravate bleeding.   You may resume your usual diet.   Drink 6 to 8 glasses of fluid a day.   Rest and sleep the first 24 to 48 hours.   Take showers instead of baths until your caregiver gives you the okay.   Do not use tampons, douche or have intercourse for 4 weeks, or as advised by your caregiver.   Do not lift anything over 10 pounds (4.5 kg) for at least 7 to 10 days, or as advised by your caregiver.   Take your temperature twice a day, for 4 to 5 days. Write them down.   Do not drink or drive when taking pain medication.   If you develop constipation:   Take a mild laxative with the advice of your caregiver.   Eat bran foods.   Drink a lot of fluids.   Try to have someone with you or available for you the first 24 to 48 hours, especially if you had a general anesthetic.   Make sure you and your family understand everything about your surgery and recovery.   Follow your caregiver's advice regarding follow-up appointments and Pap smears.  SEEK MEDICAL CARE IF:   You develop a rash.   You are dizzy or light-headed.   You feel sick to your stomach (nauseous).   You  develop a bad smelling vaginal discharge.   You have an abnormal reaction or allergy to your medication.   You need stronger pain medication.  SEEK IMMEDIATE MEDICAL CARE IF:   You have blood clots or bleeding that is heavier than a normal menstrual period, or you develop bright red bleeding.   An oral temperature above 102 F (38.9 C) develops and persists for several hours.   You have increasing cramps.   You pass out.   You have painful or bloody urine.   You start throwing up (vomiting).   The pain is not relieved with your pain medication.  Not all test results are available during your  visit. If your test results are not back during your the visit, make an appointment with your caregiver to find out the results. Do not assume everything is normal if you have not heard from your caregiver or the medical facility. It is important for you to follow up on all of your test results. Document Released: 09/07/2005 Document Revised: 08/10/2011 Document Reviewed: 06/29/2009 [facility] Patient Information 2012 [place], [place].     PATIENT INSTRUCTIONS POST-ANESTHESIA  IMMEDIATELY FOLLOWING SURGERY:  Do not drive or operate machinery for the first twenty four hours after surgery.  Do not make any important decisions for twenty four hours after surgery or while taking narcotic pain medications or sedatives.  If you develop intractable nausea and vomiting or a severe headache please notify your doctor immediately.  FOLLOW-UP:  Please make an appointment with your surgeon as instructed. You do not need to follow up with anesthesia unless specifically instructed to do so.  WOUND CARE INSTRUCTIONS (if applicable):  Keep a dry clean dressing on the anesthesia/puncture wound site if there is drainage.  Once the wound has quit draining you may leave it open to air.  Generally you should leave the bandage intact for twenty four hours unless there is drainage.  If the epidural site drains for more  than 36-48 hours please call the anesthesia department.  QUESTIONS?:  Please feel free to call your physician or the hospital operator if you have any questions, and they will be happy to assist you.     [facility] Anesthesia Department [address] [place] [phone]

## 2011-11-08 ENCOUNTER — Ambulatory Visit (HOSPITAL_COMMUNITY)
Admission: RE | Admit: 2011-11-08 | Discharge: 2011-11-08 | Disposition: A | Discharge status: Home / Self Care | Payer: No Typology Code available for payment source | Source: Ambulatory Visit | Attending: Obstetrics and Gynecology | Admitting: Obstetrics and Gynecology

## 2011-11-08 ENCOUNTER — Encounter (HOSPITAL_COMMUNITY): Payer: Self-pay | Admitting: Anesthesiology

## 2011-11-08 ENCOUNTER — Encounter (HOSPITAL_COMMUNITY)
Admission: RE | Disposition: A | Discharge status: Home / Self Care | Payer: Self-pay | Source: Ambulatory Visit | Attending: Obstetrics and Gynecology

## 2011-11-08 ENCOUNTER — Encounter (HOSPITAL_COMMUNITY): Payer: Self-pay | Admitting: *Deleted

## 2011-11-08 DIAGNOSIS — R87613 High grade squamous intraepithelial lesion on cytologic smear of cervix (HGSIL): Secondary | ICD-10-CM | POA: Insufficient documentation

## 2011-11-08 DIAGNOSIS — Z538 Procedure and treatment not carried out for other reasons: Secondary | ICD-10-CM | POA: Insufficient documentation

## 2011-11-08 DIAGNOSIS — Z01812 Encounter for preprocedural laboratory examination: Secondary | ICD-10-CM | POA: Insufficient documentation

## 2011-11-08 SURGERY — CONE BIOPSY, CERVIX
Anesthesia: General

## 2011-11-08 MED ORDER — CEFAZOLIN SODIUM 1-5 GM-% IV SOLN: 1.0000 g | INTRAVENOUS | Status: DC

## 2011-11-08 MED ORDER — CEFAZOLIN SODIUM 1-5 GM-% IV SOLN
INTRAVENOUS | Status: AC
  Filled 2011-11-08: qty 50

## 2011-11-08 SURGICAL SUPPLY — 21 items
BAG HAMPER (MISCELLANEOUS) ×3 IMPLANT
BLADE SURG SZ11 CARB STEEL (BLADE) ×3 IMPLANT
CATH ROBINSON RED A/P 16FR (CATHETERS) IMPLANT
CLOTH BEACON ORANGE TIMEOUT ST (SAFETY) ×3 IMPLANT
COVER LIGHT HANDLE STERIS (MISCELLANEOUS) ×6 IMPLANT
DRAPE PROXIMA HALF (DRAPES) ×3 IMPLANT
ELECT REM PT RETURN 9FT ADLT (ELECTROSURGICAL) ×2
ELECTRODE REM PT RTRN 9FT ADLT (ELECTROSURGICAL) ×2 IMPLANT
FORMALIN 10 PREFIL 120ML (MISCELLANEOUS) ×3 IMPLANT
GLOVE ECLIPSE 9.0 STRL (GLOVE) ×3 IMPLANT
GLOVE INDICATOR STER SZ 9 (GLOVE) ×3 IMPLANT
GOWN STRL REIN 3XL LVL4 (GOWN DISPOSABLE) ×3 IMPLANT
GOWN STRL REIN XL XLG (GOWN DISPOSABLE) ×3 IMPLANT
KIT ROOM TURNOVER AP CYSTO (KITS) ×3 IMPLANT
MANIFOLD NEPTUNE II (INSTRUMENTS) ×3 IMPLANT
PACK PERI GYN (CUSTOM PROCEDURE TRAY) ×3 IMPLANT
PAD ARMBOARD 7.5X6 YLW CONV (MISCELLANEOUS) ×3 IMPLANT
SET BASIN LINEN APH (SET/KITS/TRAYS/PACK) ×3 IMPLANT
SUT CHROMIC 2 0 CT 1 (SUTURE) ×3 IMPLANT
SYR CONTROL 10ML LL (SYRINGE) ×3 IMPLANT
TOWEL OR 17X26 4PK STRL BLUE (TOWEL DISPOSABLE) ×3 IMPLANT

## 2011-11-08 NOTE — H&P (View-Only) (Signed)
Jacqueline Morrow is an 24 y.o. female.  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 Previous biopsies in 2011 had only identified and mild dysplasia. But August 2012 Pap smear showed progression of disease   patient desires future childbearing to Pertinent Gynecological History: Menses: flow is moderate and regular every month without intermenstrual spotting Bleeding:  normal Contraception: none DES exposure: denies Blood transfusions: none Sexually transmitted diseases: no past history Previous GYN Procedures: colposcopy with biopsies 2011 Last mammogram:  not applicable Date:  Last pap: abnormal:  high-grade lesion Date:  August 2012 OB History: G1, P1   Menstrual History: Menarche age:  No LMP recorded.    Past Medical History  Diagnosis Date  . Depression   . Arthritis   . GERD (gastroesophageal reflux disease)   . Allergic rhinitis   . Migraine   . Vaginal delivery 2006  . Obese     Past Surgical History  Procedure Date  . Anterior cruciate ligament repair 2009    Family History  Problem Relation Age of Onset  . Arthritis Other     both parents & grandparents  . Heart disease Other     grandparents  . Diabetes Paternal Aunt   . Hyperlipidemia Other     grandparents  . Hypertension Maternal Grandmother     Social History:  reports that she has been smoking Cigarettes.  She has been smoking about 1 pack per day. She has never used smokeless tobacco. She reports that she drinks about .5 ounces of alcohol per week. She reports that she does not use illicit drugs.  Allergies: No Known Allergies   (Not in a hospital admission)  ROS  Negative except for history of present illness  There were no vitals taken for this visit. Physical ExamPhysical Examination: General appearance -  alert, well appearing, and in no distress and oriented to person, place, and time Mouth - mucous membranes moist, pharynx normal without lesions and dental hygiene good Chest - clear to auscultation, no wheezes, rales or rhonchi, symmetric air entry Heart - normal rate and regular rhythm Abdomen - soft, nontender, nondistended, no masses or organomegaly Pelvic - VULVA: normal appearing vulva with no masses, tenderness or lesions, VAGINA: normal appearing vagina with normal color and discharge, no lesions, CERVIX: normal appearing cervix without discharge or lesions, ectropion   cervix with extensive  Dysplasia of suspected grade 2 UTERUS: uterus is normal size, shape, consistency and nontender  No results found for this or any previous visit (from the past 24 hour(s)).  No results found.  Assessment/Plan:  cervical dysplasia high-grade , 4 quadrant disease for cold knife conization 10/18/2011  Jacqueline Morrow 10/17/2011, 9:30 PM  

## 2011-11-08 NOTE — Anesthesia Preprocedure Evaluation (Deleted)
Anesthesia Evaluation Anesthesia Physical Anesthesia Plan Anesthesia Quick Evaluation  

## 2011-11-08 NOTE — Interval H&P Note (Signed)
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 After consideration of risks, benefits and other options for treatment, the patient has consented to  Procedure(s): CONIZATION CERVIX WITH BIOPSY as a surgical intervention .  The patients' history has been reviewed, patient examined, no change in status, stable for surgery.  I have reviewed the patients' chart and labs.  Questions were answered to the patient's satisfaction.     [name]  MD Patient history reviewed with patient and partner. Her LMP was 3 weeks ago and patient acknowledges sexual activity WITHOUT PROTECTION as recently as a week ago.  Given the potential for pregnancy, which would be complicated by a conization done in early pregnancy, I have cancelled the case and will reschedule promptly after next menses.  Patient will be seen in the office in a week, and will notify  Our office at onset of next menses.

## 2011-11-08 NOTE — OR Nursing (Signed)
Dr Emelda Fear in pre op rm 5 to see patient.  After discussing with patient,  procedure rescheduled for after Jacqueline Morrow' next period.  Office to call patient.

## 2011-11-18 ENCOUNTER — Encounter (HOSPITAL_COMMUNITY): Payer: Self-pay | Admitting: Emergency Medicine

## 2011-11-18 ENCOUNTER — Emergency Department (HOSPITAL_COMMUNITY)
Admission: EM | Admit: 2011-11-18 | Discharge: 2011-11-18 | Disposition: A | Discharge status: Home / Self Care | Payer: No Typology Code available for payment source | Attending: Emergency Medicine | Admitting: Emergency Medicine

## 2011-11-18 DIAGNOSIS — F17200 Nicotine dependence, unspecified, uncomplicated: Secondary | ICD-10-CM | POA: Insufficient documentation

## 2011-11-18 DIAGNOSIS — K089 Disorder of teeth and supporting structures, unspecified: Secondary | ICD-10-CM | POA: Insufficient documentation

## 2011-11-18 DIAGNOSIS — K0889 Other specified disorders of teeth and supporting structures: Secondary | ICD-10-CM

## 2011-11-18 DIAGNOSIS — K029 Dental caries, unspecified: Secondary | ICD-10-CM | POA: Insufficient documentation

## 2011-11-18 MED ORDER — OXYCODONE-ACETAMINOPHEN 5-325 MG PO TABS: 2.0000 | ORAL_TABLET | ORAL | Status: AC | PRN

## 2011-11-18 MED ORDER — PENICILLIN V POTASSIUM 500 MG PO TABS: 500.0000 mg | ORAL_TABLET | Freq: Four times a day (QID) | ORAL | Status: AC

## 2011-11-18 NOTE — ED Provider Notes (Signed)
History     CSN: [id number] Arrival date & time: 11/18/2011  5:57 PM   First MD Initiated Contact with Patient 11/18/11 1809      Chief Complaint  Patient presents with  . Dental Pain     HPI  24yoF pw dental pain. Pt c/o 40month, worsening x 2 days Lt lower dental pain. She is having mild pain Lt upper molars as well. C/o subj fever, chills. Denies diffuculty opening her mouth. Denies headache, dizziness, neck pain, stiffness. Denies other complaints today. States she has no insurance for dentist.   ED Notes, ED Provider Notes       Cardell Peach, RN 11/18/2011 18:08      Pt presenting to ed with c/o dental pain    Past Medical History  Diagnosis Date  . Depression   . Arthritis   . GERD (gastroesophageal reflux disease)   . Allergic rhinitis   . Migraine   . Vaginal delivery 2006  . Obese     Past Surgical History  Procedure Date  . Anterior cruciate ligament repair 2009    Family History  Problem Relation Age of Onset  . Arthritis Other     both parents & grandparents  . Heart disease Other     grandparents  . Hyperlipidemia Other     grandparents  . Diabetes Paternal Aunt   . Hypertension Maternal Grandmother   . Anesthesia problems Neg Hx   . Hypotension Neg Hx   . Malignant hyperthermia Neg Hx   . Pseudochol deficiency Neg Hx     History  Substance Use Topics  . Smoking status: Current Everyday Smoker -- 1.0 packs/day for 8 years    Types: Cigarettes  . Smokeless tobacco: Never Used  . Alcohol Use: No    OB History    Grav Para Term Preterm Abortions TAB SAB Ect Mult Living                  Review of Systems  All other systems reviewed and are negative.   except as noted HPI  Allergies  Review of patient's allergies indicates no known allergies.  Home Medications   Current Outpatient Rx  Name Route Sig Dispense Refill  . CYCLOBENZAPRINE HCL 5 MG PO TABS Oral Take 1 tablet (5 mg total) by mouth every 8 (eight) hours as needed for  muscle spasms. 30 tablet 1  . MELOXICAM 15 MG PO TABS Oral Take 1 tablet (15 mg total) by mouth daily. 30 tablet 2  . OXYCODONE-ACETAMINOPHEN 5-325 MG PO TABS Oral Take 2 tablets by mouth every 4 (four) hours as needed for pain. 15 tablet 0  . PENICILLIN V POTASSIUM 500 MG PO TABS Oral Take 1 tablet (500 mg total) by mouth 4 (four) times daily. 40 tablet 0    BP 125/76  Pulse 74  Temp(Src) 98.7 F (37.1 C) (Oral)  Resp 20  SpO2 99%  LMP 10/19/2011  Physical Exam  Nursing note and vitals reviewed. Constitutional: She is oriented to person, place, and time. She appears well-developed.  HENT:  Head: Atraumatic.  Mouth/Throat: Oropharynx is clear and moist.    Eyes: Conjunctivae and EOM are normal. Pupils are equal, round, and reactive to light.  Neck: Normal range of motion. Neck supple.  Cardiovascular: Normal rate, regular rhythm, normal heart sounds and intact distal pulses.   Pulmonary/Chest: Effort normal and breath sounds normal. No respiratory distress. She has no wheezes. She has no rales.  Abdominal: Soft. She exhibits  no distension. There is no tenderness. There is no rebound and no guarding.  Musculoskeletal: Normal range of motion.  Neurological: She is alert and oriented to person, place, and time.  Skin: Skin is warm and dry. No rash noted.  Psychiatric: She has a normal mood and affect.    ED Course  Procedures (including critical care time)  Labs Reviewed - No data to display No results found.   1. Pain, dental   2. Tooth decay      MDM  Dental pain. Poor dentition. Possible pulpitis/apical abscess. Will cover with pcn. Percocet. D/C with dentistry referrals        Forbes Cellar, MD 11/18/11 1816

## 2011-11-18 NOTE — ED Notes (Signed)
Pt presenting to ed with c/o dental pain

## 2012-01-20 ENCOUNTER — Encounter (HOSPITAL_COMMUNITY): Payer: Self-pay | Admitting: Pharmacy Technician

## 2012-01-25 ENCOUNTER — Other Ambulatory Visit: Payer: Self-pay | Admitting: Obstetrics and Gynecology

## 2012-01-25 ENCOUNTER — Encounter (HOSPITAL_COMMUNITY)
Admission: RE | Admit: 2012-01-25 | Discharge: 2012-01-25 | Disposition: A | Discharge status: Home / Self Care | Payer: No Typology Code available for payment source | Source: Ambulatory Visit | Attending: Obstetrics and Gynecology | Admitting: Obstetrics and Gynecology

## 2012-01-25 ENCOUNTER — Encounter (HOSPITAL_COMMUNITY): Payer: Self-pay

## 2012-01-25 HISTORY — DX: Bipolar disorder, unspecified: F31.9

## 2012-01-25 LAB — CBC
HCT: 42 % (ref 36.0–46.0)
MCH: 31.7 pg (ref 26.0–34.0)
MCV: 91.7 fL (ref 78.0–100.0)
Platelets: 272 10*3/uL (ref 150–400)
RBC: 4.58 MIL/uL (ref 3.87–5.11)

## 2012-01-25 NOTE — Patient Instructions (Signed)
21 Jacqueline Morrow  01/25/2012   Your procedure is scheduled on:  01/31/12  Report to [facility] at 11:25 AM.  Call this number if you have problems the morning of surgery: [phone]   Remember:   Do not eat food:After Midnight.  May have clear liquids:until Midnight .  Clear liquids include soda, tea, black coffee, apple or grape juice, broth.  Take these medicines the morning of surgery with A SIP OF WATER: Meloxicam and Cyclobenzaprine.   Do not wear jewelry, make-up or nail polish.  Do not wear lotions, powders, or perfumes. You may wear deodorant.  Do not shave 48 hours prior to surgery.  Do not bring valuables to the hospital.  Contacts, dentures or bridgework may not be worn into surgery.  Leave suitcase in the car. After surgery it may be brought to your room.  For patients admitted to the hospital, checkout time is 11:00 AM the day of discharge.   Patients discharged the day of surgery will not be allowed to drive home.  Name and phone number of your driver:   Special Instructions: CHG Shower Use Special Wash: 1/2 bottle night before surgery and 1/2 bottle morning of surgery.   Please read over the following fact sheets that you were given: Pain Booklet, MRSA Information, Surgical Site Infection Prevention, Anesthesia Post-op Instructions and Care and Recovery After Surgery    Conization of the Cervix Conization is the cutting (excision) of a cone-shaped portion of the cervix. This procedure is usually done when there is abnormal bleeding from the cervix. It can also be done to evaluate an abnormal Pap smear or if an abnormality is seen on the cervix during an exam. Conization of the cervix is not done during a menstrual period or pregnancy. BEFORE THE PROCEDURE  Do not eat or drink anything for 6 to 8 hours before the procedure, especially if you are going to be given a drug to make you sleep (general anesthetic).   Do not take aspirin or blood thinners for at least a week  before the procedure, or as directed.   Arrive at least an hour before the procedure to read and sign the necessary forms.   Arrange for someone to take you home after the procedure.   If you smoke, do not smoke for 2 weeks before the procedure.  LET YOUR CAREGIVER KNOW THE FOLLOWING:  Allergies to food or medications.   All the medications you are taking including over-the-counter and prescription medications, herbs, eyedrops, and creams.   You develop a cold or an infection.   If you are using illegal drugs or drinking too much alcohol.   Your smoking habits.   Previous problems with anesthetics including novocaine.   The possibility of being pregnant.   History of blood clots or other bleeding problems.   Other medical problems.  RISKS AND COMPLICATIONS   Bleeding.   Infection.   Damage to the cervix.   Injury to surrounding organs.   Problems with the anesthesia.  PROCEDURE Conization of the cervix can be done by:  Cold knife. This type cuts out the cervical canal and the transformation zone (where the normal cells end and the abnormal cells begin) with a scalpel.   LEEP (electrocautery). This type is done with a thin wire that can cut and burn (cauterize) the cervical tissue with an electrical current.   Laser treatment. This type cuts and burns (cauterizes) the tissue of the cervix to prevent bleeding with a laser beam.  You will be given a drug to make you sleep (general anesthetic) for cold knife and laser treatments, and you will be given a numbing medication (local anesthetic) for the LEEP procedure. Conization is usually done using colposcopy. Colposcopy magnifies the cervix to see it more clearly. The tissue removed is examined under a microscope by a doctor Psychologist, educational). The pathologist will provide a report to your caregiver. This will help your caregiver decide if further treatment is necessary. This report will also help your caregiver decide on the best  treatment if your results are abnormal.  AFTER THE PROCEDURE  If you had a general anesthetic, you may be groggy for a couple of hours after the procedure.   If you had a local anesthetic, you will be advised to rest at the surgical center or caregiver's office until you are stable and feel ready to go home.   Have someone take you home.   You may have some cramping for a couple of days.   You may have a bloody discharge or light bleeding for 2 to 4 weeks.   You may have a black discharge coming from the vagina. This is from the paste used on the cervix to prevent bleeding. This is normal discharge.  HOME CARE INSTRUCTIONS   Do not drive for 24 hours.   Avoid strenuous activities and exercises for at least 7 - 10 days.   Only take over-the-counter or prescription medicines for pain, discomfort, or fever as directed by your caregiver.   Do not take aspirin. It can cause or aggravate bleeding.   You may resume your usual diet.   Drink 6 to 8 glasses of fluid a day.   Rest and sleep the first 24 to 48 hours.   Take showers instead of baths until your caregiver gives you the okay.   Do not use tampons, douche or have intercourse for 4 weeks, or as advised by your caregiver.   Do not lift anything over 10 pounds (4.5 kg) for at least 7 to 10 days, or as advised by your caregiver.   Take your temperature twice a day, for 4 to 5 days. Write them down.   Do not drink or drive when taking pain medication.   If you develop constipation:   Take a mild laxative with the advice of your caregiver.   Eat bran foods.   Drink a lot of fluids.   Try to have someone with you or available for you the first 24 to 48 hours, especially if you had a general anesthetic.   Make sure you and your family understand everything about your surgery and recovery.   Follow your caregiver's advice regarding follow-up appointments and Pap smears.  SEEK MEDICAL CARE IF:   You develop a rash.    You are dizzy or light-headed.   You feel sick to your stomach (nauseous).   You develop a bad smelling vaginal discharge.   You have an abnormal reaction or allergy to your medication.   You need stronger pain medication.  SEEK IMMEDIATE MEDICAL CARE IF:   You have blood clots or bleeding that is heavier than a normal menstrual period, or you develop bright red bleeding.   An oral temperature above 102 F (38.9 C) develops and persists for several hours.   You have increasing cramps.   You pass out.   You have painful or bloody urine.   You start throwing up (vomiting).   The pain is not relieved with  your pain medication.  Not all test results are available during your visit. If your test results are not back during your the visit, make an appointment with your caregiver to find out the results. Do not assume everything is normal if you have not heard from your caregiver or the medical facility. It is important for you to follow up on all of your test results. Document Released: 09/07/2005 Document Revised: 08/10/2011 Document Reviewed: 06/29/2009 [facility] Patient Information 2012 [place], [place].   PATIENT INSTRUCTIONS POST-ANESTHESIA  IMMEDIATELY FOLLOWING SURGERY:  Do not drive or operate machinery for the first twenty four hours after surgery.  Do not make any important decisions for twenty four hours after surgery or while taking narcotic pain medications or sedatives.  If you develop intractable nausea and vomiting or a severe headache please notify your doctor immediately.  FOLLOW-UP:  Please make an appointment with your surgeon as instructed. You do not need to follow up with anesthesia unless specifically instructed to do so.  WOUND CARE INSTRUCTIONS (if applicable):  Keep a dry clean dressing on the anesthesia/puncture wound site if there is drainage.  Once the wound has quit draining you may leave it open to air.  Generally you should leave the bandage intact  for twenty four hours unless there is drainage.  If the epidural site drains for more than 36-48 hours please call the anesthesia department.  QUESTIONS?:  Please feel free to call your physician or the hospital operator if you have any questions, and they will be happy to assist you.     [facility] Anesthesia Department [address] [place] [phone]

## 2012-01-25 NOTE — H&P (Signed)
Jacqueline Morrow is an 25 y.o. female. She is admitted for cold knife conization of the cervix due to have wide area of cervical dysplasia felt to large for effective LEEP procedure in the office. Most recent Pap smear showed high-grade dysplasia. Patient is considered both diagnostic and therapeutic. Menses have been absent since November. Pregnancy test is been checked and is negative 01/13/2012. She's been given Provera to use a menstrual withdrawal bleed, serum pregnancy test will be checked 2/ 13/ 2013. She was recently treated for urinary tract infection with Septra reviewed with patient including discussion of reason for recommending CKC. Slight increased risk of preterm delivery after conization are addressed during this visit 01/13/2012. 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    Past Medical History  Diagnosis Date  . Depression   . Arthritis   . GERD (gastroesophageal reflux disease)   . Allergic rhinitis   . Migraine   . Vaginal delivery 2006  . Obese     Past Surgical History  Procedure Date  . Anterior cruciate ligament repair 2009    Family History  Problem Relation Age of Onset  . Arthritis Other     both parents & grandparents  . Heart disease Other     grandparents  . Hyperlipidemia Other     grandparents  . Diabetes Paternal Aunt   . Hypertension Maternal Grandmother   . Anesthesia problems Neg Hx   . Hypotension Neg Hx   . Malignant hyperthermia Neg Hx   . Pseudochol deficiency  Neg Hx     Social History:  reports that she has been smoking Cigarettes.  She has a 8 pack-year smoking history. She has never used smokeless tobacco. She reports that she does not drink alcohol or use illicit drugs.  Allergies:  Allergies  Allergen Reactions  . Raspberry Anaphylaxis and Rash     (Not in a hospital admission)  ROSReview of Systems - General ROS: negative positive for  - obesity, amenorrhea, recent UTI treated with Septra for Escherichia coli UTI diagnosed 01/13/2012 negative for - weight loss  There were no vitals taken for this visit. Physical ExamPhysical Examination: General appearance - alert, well appearing, and in no distress, oriented to person, place, and time and overweight Mental status - alert, oriented to person, place, and time, affect appropriate to mood Chest - clear to auscultation, no wheezes, rales or rhonchi, symmetric air entry Heart - normal rate and regular rhythm Abdomen - soft, nontender, nondistended, no masses or organomegaly Pelvic - VULVA: normal appearing vulva with no masses, tenderness or lesions,  VAGINA: normal appearing vagina with normal color and discharge, no lesions,  CERVIX: ectropion everted, lesion present widespread area of suspected dysplasia over transition zone,   UTERUS: uterus is normal size, shape, consistency and nontender,  ADNEXA: normal adnexa in size, nontender and no masses No results found for this or any previous visit (from the past 24 hour(s)).  No results found.  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  

## 2012-01-31 ENCOUNTER — Encounter (HOSPITAL_COMMUNITY)
Admission: RE | Disposition: A | Discharge status: Home / Self Care | Payer: Self-pay | Source: Ambulatory Visit | Attending: Obstetrics and Gynecology

## 2012-01-31 ENCOUNTER — Encounter (HOSPITAL_COMMUNITY): Payer: Self-pay | Admitting: Anesthesiology

## 2012-01-31 ENCOUNTER — Ambulatory Visit (HOSPITAL_COMMUNITY)
Admission: RE | Admit: 2012-01-31 | Discharge: 2012-01-31 | Disposition: A | Discharge status: Home / Self Care | Payer: No Typology Code available for payment source | Source: Ambulatory Visit | Attending: Obstetrics and Gynecology | Admitting: Obstetrics and Gynecology

## 2012-01-31 ENCOUNTER — Ambulatory Visit (HOSPITAL_COMMUNITY): Payer: No Typology Code available for payment source | Admitting: Anesthesiology

## 2012-01-31 ENCOUNTER — Encounter (HOSPITAL_COMMUNITY): Payer: Self-pay | Admitting: *Deleted

## 2012-01-31 ENCOUNTER — Other Ambulatory Visit: Payer: Self-pay | Admitting: Obstetrics and Gynecology

## 2012-01-31 DIAGNOSIS — N871 Moderate cervical dysplasia: Secondary | ICD-10-CM | POA: Insufficient documentation

## 2012-01-31 DIAGNOSIS — Z01812 Encounter for preprocedural laboratory examination: Secondary | ICD-10-CM | POA: Insufficient documentation

## 2012-01-31 DIAGNOSIS — D069 Carcinoma in situ of cervix, unspecified: Secondary | ICD-10-CM | POA: Insufficient documentation

## 2012-01-31 HISTORY — PX: CERVICAL CONIZATION W/BX: SHX1330

## 2012-01-31 SURGERY — CONE BIOPSY, CERVIX
Anesthesia: General | Site: Cervix | Wound class: Clean Contaminated

## 2012-01-31 MED ORDER — KETOROLAC TROMETHAMINE 10 MG PO TABS: 10.0000 mg | ORAL_TABLET | Freq: Four times a day (QID) | ORAL | Status: AC | PRN

## 2012-01-31 MED ORDER — FENTANYL CITRATE 0.05 MG/ML IJ SOLN
INTRAMUSCULAR | Status: AC
  Administered 2012-01-31: 50 ug via INTRAVENOUS
  Filled 2012-01-31: qty 2

## 2012-01-31 MED ORDER — KETOROLAC TROMETHAMINE 10 MG PO TABS
10.0000 mg | ORAL_TABLET | Freq: Four times a day (QID) | ORAL | Status: DC | PRN
  Filled 2012-01-31: qty 1

## 2012-01-31 MED ORDER — LACTATED RINGERS IV SOLN
INTRAVENOUS | Status: DC
  Administered 2012-01-31: 12:00:00 via INTRAVENOUS

## 2012-01-31 MED ORDER — ONDANSETRON HCL 4 MG/2ML IJ SOLN
4.0000 mg | Freq: Once | INTRAMUSCULAR | Status: AC
  Administered 2012-01-31: 4 mg via INTRAVENOUS

## 2012-01-31 MED ORDER — LIDOCAINE HCL 1 % IJ SOLN
INTRAMUSCULAR | Status: DC | PRN
  Administered 2012-01-31: 20 mg via INTRADERMAL

## 2012-01-31 MED ORDER — SUCCINYLCHOLINE CHLORIDE 20 MG/ML IJ SOLN
INTRAMUSCULAR | Status: DC | PRN
  Administered 2012-01-31: 120 mg via INTRAVENOUS

## 2012-01-31 MED ORDER — PROPOFOL 10 MG/ML IV BOLUS
INTRAVENOUS | Status: DC | PRN
  Administered 2012-01-31: 200 mg via INTRAVENOUS

## 2012-01-31 MED ORDER — ONDANSETRON HCL 4 MG/2ML IJ SOLN: 4.0000 mg | Freq: Once | INTRAMUSCULAR | Status: DC | PRN

## 2012-01-31 MED ORDER — FERRIC SUBSULFATE 259 MG/GM EX SOLN
CUTANEOUS | Status: DC | PRN
  Administered 2012-01-31: 1

## 2012-01-31 MED ORDER — ONDANSETRON HCL 4 MG/2ML IJ SOLN
INTRAMUSCULAR | Status: AC
  Filled 2012-01-31: qty 2

## 2012-01-31 MED ORDER — FENTANYL CITRATE 0.05 MG/ML IJ SOLN
INTRAMUSCULAR | Status: AC
  Filled 2012-01-31: qty 2

## 2012-01-31 MED ORDER — FENTANYL CITRATE 0.05 MG/ML IJ SOLN
25.0000 ug | INTRAMUSCULAR | Status: DC | PRN
  Administered 2012-01-31 (×2): 50 ug via INTRAVENOUS

## 2012-01-31 MED ORDER — PROPOFOL 10 MG/ML IV EMUL
INTRAVENOUS | Status: AC
  Filled 2012-01-31: qty 20

## 2012-01-31 MED ORDER — MIDAZOLAM HCL 2 MG/2ML IJ SOLN
INTRAMUSCULAR | Status: AC
  Filled 2012-01-31: qty 2

## 2012-01-31 MED ORDER — ROCURONIUM BROMIDE 100 MG/10ML IV SOLN
INTRAVENOUS | Status: DC | PRN
  Administered 2012-01-31: 5 mg via INTRAVENOUS
  Administered 2012-01-31: 15 mg via INTRAVENOUS

## 2012-01-31 MED ORDER — FENTANYL CITRATE 0.05 MG/ML IJ SOLN
INTRAMUSCULAR | Status: DC | PRN
  Administered 2012-01-31 (×4): 50 ug via INTRAVENOUS

## 2012-01-31 MED ORDER — LIDOCAINE HCL (PF) 1 % IJ SOLN
INTRAMUSCULAR | Status: AC
  Filled 2012-01-31: qty 5

## 2012-01-31 MED ORDER — SUCCINYLCHOLINE CHLORIDE 20 MG/ML IJ SOLN
INTRAMUSCULAR | Status: AC
  Filled 2012-01-31: qty 1

## 2012-01-31 MED ORDER — IODINE STRONG (LUGOLS) 5 % PO SOLN
ORAL | Status: DC | PRN
  Administered 2012-01-31: 2 mL

## 2012-01-31 MED ORDER — KETOROLAC TROMETHAMINE 30 MG/ML IJ SOLN
INTRAMUSCULAR | Status: AC
  Administered 2012-01-31: 30 mg
  Filled 2012-01-31: qty 1

## 2012-01-31 MED ORDER — ROCURONIUM BROMIDE 50 MG/5ML IV SOLN
INTRAVENOUS | Status: AC
  Filled 2012-01-31: qty 1

## 2012-01-31 MED ORDER — MIDAZOLAM HCL 2 MG/2ML IJ SOLN
1.0000 mg | INTRAMUSCULAR | Status: DC | PRN
  Administered 2012-01-31: 2 mg via INTRAVENOUS

## 2012-01-31 MED ORDER — HEMOSTATIC AGENTS (NO CHARGE) OPTIME
TOPICAL | Status: DC | PRN
  Administered 2012-01-31: 1 via TOPICAL

## 2012-01-31 MED ORDER — BUPIVACAINE-EPINEPHRINE PF 0.5-1:200000 % IJ SOLN
INTRAMUSCULAR | Status: AC
  Filled 2012-01-31: qty 10

## 2012-01-31 MED ORDER — FERRIC SUBSULFATE 259 MG/GM EX SOLN
CUTANEOUS | Status: AC
Start: 2012-01-31 — End: 2012-01-31
  Filled 2012-01-31: qty 8

## 2012-01-31 MED ORDER — OXYCODONE-ACETAMINOPHEN 5-325 MG PO TABS: 1.0000 | ORAL_TABLET | ORAL | Status: AC | PRN

## 2012-01-31 MED ORDER — BUPIVACAINE-EPINEPHRINE 0.5% -1:200000 IJ SOLN
INTRAMUSCULAR | Status: DC | PRN
  Administered 2012-01-31: 7 mL

## 2012-01-31 SURGICAL SUPPLY — 25 items
BAG HAMPER (MISCELLANEOUS) ×2 IMPLANT
BLADE SURG SZ11 CARB STEEL (BLADE) ×2 IMPLANT
CATH ROBINSON RED A/P 16FR (CATHETERS) IMPLANT
CLOTH BEACON ORANGE TIMEOUT ST (SAFETY) ×2 IMPLANT
COVER LIGHT HANDLE STERIS (MISCELLANEOUS) ×4 IMPLANT
DRAPE PROXIMA HALF (DRAPES) ×2 IMPLANT
ELECT REM PT RETURN 9FT ADLT (ELECTROSURGICAL) ×2
ELECTRODE REM PT RTRN 9FT ADLT (ELECTROSURGICAL) ×1 IMPLANT
FORMALIN 10 PREFIL 120ML (MISCELLANEOUS) ×2 IMPLANT
GLOVE ECLIPSE 6.5 STRL STRAW (GLOVE) ×2 IMPLANT
GLOVE ECLIPSE 9.0 STRL (GLOVE) ×2 IMPLANT
GLOVE EXAM NITRILE MD LF STRL (GLOVE) ×1 IMPLANT
GLOVE INDICATOR 7.0 STRL GRN (GLOVE) ×3 IMPLANT
GLOVE INDICATOR STER SZ 9 (GLOVE) ×2 IMPLANT
GOWN STRL REIN 3XL LVL4 (GOWN DISPOSABLE) ×4 IMPLANT
GOWN STRL REIN XL XLG (GOWN DISPOSABLE) ×2 IMPLANT
HEMOSTAT SURGICEL 4X8 (HEMOSTASIS) ×1 IMPLANT
KIT ROOM TURNOVER AP CYSTO (KITS) ×2 IMPLANT
MANIFOLD NEPTUNE II (INSTRUMENTS) ×2 IMPLANT
PACK PERI GYN (CUSTOM PROCEDURE TRAY) ×2 IMPLANT
PAD ARMBOARD 7.5X6 YLW CONV (MISCELLANEOUS) ×2 IMPLANT
SET BASIN LINEN APH (SET/KITS/TRAYS/PACK) ×2 IMPLANT
SUT CHROMIC 2 0 CT 1 (SUTURE) ×4 IMPLANT
SYR CONTROL 10ML LL (SYRINGE) ×2 IMPLANT
TOWEL OR 17X26 4PK STRL BLUE (TOWEL DISPOSABLE) ×2 IMPLANT

## 2012-01-31 NOTE — Transfer of Care (Signed)
Immediate Anesthesia Transfer of Care Note  Patient: Jacqueline Morrow  Procedure(s) Performed: Procedure(s) (LRB): CONIZATION CERVIX WITH BIOPSY (N/A)  Patient Location: PACU  Anesthesia Type: General  Level of Consciousness: awake, alert  and oriented  Airway & Oxygen Therapy: Patient Spontanous Breathing and Patient connected to face mask oxygen  Post-op Assessment: Report given to PACU RN  Post vital signs: Reviewed and stable  Complications: No apparent anesthesia complications

## 2012-01-31 NOTE — Anesthesia Postprocedure Evaluation (Signed)
  Anesthesia Post-op Note  Patient: Jacqueline Morrow  Procedure(s) Performed: Procedure(s) (LRB): CONIZATION CERVIX WITH BIOPSY (N/A)  Patient Location: PACU  Anesthesia Type: General  Level of Consciousness: awake  Airway and Oxygen Therapy: Patient Spontanous Breathing and Patient connected to face mask oxygen  Post-op Pain: mild  Post-op Assessment: Post-op Vital signs reviewed, Patient's Cardiovascular Status Stable, Respiratory Function Stable, Patent Airway and No signs of Nausea or vomiting  Post-op Vital Signs: Reviewed and stable  Complications: No apparent anesthesia complications

## 2012-01-31 NOTE — H&P (View-Only) (Signed)
Jacqueline Morrow is an 26 y.o. female. She is admitted for cold knife conization of the cervix due to have wide area of cervical dysplasia felt to large for effective LEEP procedure in the office. Most recent Pap smear showed high-grade dysplasia. Patient is considered both diagnostic and therapeutic. Menses have been absent since November. Pregnancy test is been checked and is negative 01/13/2012. She's been given Provera to use a menstrual withdrawal bleed, serum pregnancy test will be checked 2/ 13/ 2013. She was recently treated for urinary tract infection with Septra reviewed with patient including discussion of reason for recommending CKC. Slight increased risk of preterm delivery after conization are addressed during this visit 01/13/2012. 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    Past Medical History  Diagnosis Date  . Depression   . Arthritis   . GERD (gastroesophageal reflux disease)   . Allergic rhinitis   . Migraine   . Vaginal delivery 2006  . Obese     Past Surgical History  Procedure Date  . Anterior cruciate ligament repair 2009    Family History  Problem Relation Age of Onset  . Arthritis Other     both parents & grandparents  . Heart disease Other     grandparents  . Hyperlipidemia Other     grandparents  . Diabetes Paternal Aunt   . Hypertension Maternal Grandmother   . Anesthesia problems Neg Hx   . Hypotension Neg Hx   . Malignant hyperthermia Neg Hx   . Pseudochol deficiency  Neg Hx     Social History:  reports that she has been smoking Cigarettes.  She has a 8 pack-year smoking history. She has never used smokeless tobacco. She reports that she does not drink alcohol or use illicit drugs.  Allergies:  Allergies  Allergen Reactions  . Raspberry Anaphylaxis and Rash     (Not in a hospital admission)  ROSReview of Systems - General ROS: negative positive for  - obesity, amenorrhea, recent UTI treated with Septra for Escherichia coli UTI diagnosed 01/13/2012 negative for - weight loss  There were no vitals taken for this visit. Physical ExamPhysical Examination: General appearance - alert, well appearing, and in no distress, oriented to person, place, and time and overweight Mental status - alert, oriented to person, place, and time, affect appropriate to mood Chest - clear to auscultation, no wheezes, rales or rhonchi, symmetric air entry Heart - normal rate and regular rhythm Abdomen - soft, nontender, nondistended, no masses or organomegaly Pelvic - VULVA: normal appearing vulva with no masses, tenderness or lesions,  VAGINA: normal appearing vagina with normal color and discharge, no lesions,  CERVIX: ectropion everted, lesion present widespread area of suspected dysplasia over transition zone,   UTERUS: uterus is normal size, shape, consistency and nontender,  ADNEXA: normal adnexa in size, nontender and no masses No results found for this or any previous visit (from the past 24 hour(s)).  No results found.  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

## 2012-01-31 NOTE — Anesthesia Preprocedure Evaluation (Signed)
Anesthesia Evaluation  Patient identified by MRN, date of birth, ID band Patient awake    Reviewed: Allergy & Precautions, H&P , NPO status , Patient's Chart, lab work & pertinent test results  History of Anesthesia Complications Negative for: history of anesthetic complications  Airway Mallampati: II  Neck ROM: Full    Dental  (+) Poor Dentition, Chipped, Missing and Dental Advisory Given   Pulmonary  clear to auscultation        Cardiovascular hypertension, Pt. on medications Regular Normal    Neuro/Psych  Headaches, Depression    GI/Hepatic GERD-  Medicated and Controlled,  Endo/Other  Morbid obesity  Renal/GU      Musculoskeletal   Abdominal (+) obese,   Peds  Hematology   Anesthesia Other Findings   Reproductive/Obstetrics                           Anesthesia Physical Anesthesia Plan  ASA: II  Anesthesia Plan: General   Post-op Pain Management:    Induction: Intravenous, Rapid sequence and Cricoid pressure planned  Airway Management Planned: Oral ETT  Additional Equipment:   Intra-op Plan:   Post-operative Plan: Extubation in OR  Informed Consent: I have reviewed the patients History and Physical, chart, labs and discussed the procedure including the risks, benefits and alternatives for the proposed anesthesia with the patient or authorized representative who has indicated his/her understanding and acceptance.     Plan Discussed with:   Anesthesia Plan Comments:         Anesthesia Quick Evaluation

## 2012-01-31 NOTE — Discharge Instructions (Addendum)
Use pads, not tampons. Notify Dr Emelda Fear for  Any bleeding of more than a period in  severity. He may be reached at [phone] (cell)

## 2012-01-31 NOTE — Interval H&P Note (Signed)
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 After consideration of risks, benefits and other options for treatment, the patient has consented to  Procedure(s) (LRB): CONIZATION CERVIX WITH BIOPSY (N/A) as a surgical intervention .  The patients' history has been reviewed, patient examined, no change in status, stable for surgery.  I have reviewed the patients' chart and labs.  Questions were answered to the patient's satisfaction.     [name] V  Lmp began last Friday. On OCP"S, No changes in procedure or pt condition.

## 2012-01-31 NOTE — Anesthesia Procedure Notes (Signed)
Procedure Name: Intubation Date/Time: 01/31/2012 1:08 PM Performed by: Glynn Octave Pre-anesthesia Checklist: Patient identified, Patient being monitored, Timeout performed, Emergency Drugs available and Suction available Patient Re-evaluated:Patient Re-evaluated prior to inductionOxygen Delivery Method: Circle System Utilized Preoxygenation: Pre-oxygenation with 100% oxygen Intubation Type: IV induction, Rapid sequence and Cricoid Pressure applied Ventilation: Mask ventilation without difficulty Laryngoscope Size: Mac and 3 Grade View: Grade II Tube type: Oral Tube size: 7.0 mm Number of attempts: 1 Airway Equipment and Method: stylet Placement Confirmation: ETT inserted through vocal cords under direct vision,  positive ETCO2 and breath sounds checked- equal and bilateral Secured at: 22 cm Tube secured with: Tape Dental Injury: Teeth and Oropharynx as per pre-operative assessment

## 2012-01-31 NOTE — Brief Op Note (Signed)
01/31/2012  2:04 PM  PATIENT:  Jacqueline Morrow  26 y.o. 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  PROCEDURE:  Procedure(s) (LRB): CONIZATION CERVIX WITH BIOPSY (N/A)  SURGEON:  Surgeon(s) and Role:    * [name], MD - Primary  PHYSICIAN ASSISTANT:   ASSISTANTS: Henderson, CST   ANESTHESIA:   local and general  EBL:  Total I/O In: -  Out: 160 [Blood:160]  BLOOD ADMINISTERED:none  DRAINS: Monsels solution then surgicel applied to cervix   LOCAL MEDICATIONS USED:  MARCAINE    and Amount: 10 ml  SPECIMEN:  Source of Specimen:  cervix surface  DISPOSITION OF SPECIMEN:  PATHOLOGY  COUNTS:  YES  TOURNIQUET:  * No tourniquets in log *  DICTATION: .Dragon Dictation Presenting operating room prepped and draped for vaginal procedure Lugol solution was applied to the cervix and upper vagina then a final wide anteverted area of transition zone, in the area of previously noted dysplasia felt to be moderate to severe. Cervical branch of the uterine vessels were ligated on either side with a stitch of 2-0 chromic, then a very wide and partially 5 mm date the sedation specimen was removed, and a single piece that had a wafer-like shape resident a true cone shaped specimen. The cervix was quite vascular and or Sturmdorf type sutures were placed in each quadrant sewing from outside to in the inner cervical canal and then back out. Dramatic improvement in bleeding resulted from the stitches then cells was applied. Small bit of oozing persisted and so was treated with Surgicel placed across the cervix and then held in place placed with the 2-0 suture tied loosely beneath the Surgicel. Specimen was passed off the table cervix was inspected and found hemostatic and patient allowed to go recovery room. Midportion the case and at the end  of the case Marcaine checked it around the cervix to the analgesia and improved hemostasis patient tolerated procedure well with recovery in stable condition  PLAN OF CARE: Discharge to home after PACU  PATIENT DISPOSITION:  PACU - hemodynamically stable.   Delay start of Pharmacological VTE agent (>24hrs) due to surgical blood loss or risk of bleeding: not applicable

## 2012-01-31 NOTE — Op Note (Signed)
See dictated details in brief of note

## 2012-02-03 ENCOUNTER — Encounter (HOSPITAL_COMMUNITY): Payer: Self-pay | Admitting: Obstetrics and Gynecology

## 2012-06-24 ENCOUNTER — Encounter (HOSPITAL_COMMUNITY): Payer: Self-pay | Admitting: *Deleted

## 2012-06-24 ENCOUNTER — Emergency Department (HOSPITAL_COMMUNITY): Payer: No Typology Code available for payment source

## 2012-06-24 ENCOUNTER — Emergency Department (HOSPITAL_COMMUNITY)
Admission: EM | Admit: 2012-06-24 | Discharge: 2012-06-24 | Disposition: A | Discharge status: Home / Self Care | Payer: No Typology Code available for payment source | Attending: Emergency Medicine | Admitting: Emergency Medicine

## 2012-06-24 DIAGNOSIS — E669 Obesity, unspecified: Secondary | ICD-10-CM | POA: Insufficient documentation

## 2012-06-24 DIAGNOSIS — F17200 Nicotine dependence, unspecified, uncomplicated: Secondary | ICD-10-CM | POA: Insufficient documentation

## 2012-06-24 DIAGNOSIS — K219 Gastro-esophageal reflux disease without esophagitis: Secondary | ICD-10-CM | POA: Insufficient documentation

## 2012-06-24 DIAGNOSIS — J069 Acute upper respiratory infection, unspecified: Secondary | ICD-10-CM

## 2012-06-24 DIAGNOSIS — M129 Arthropathy, unspecified: Secondary | ICD-10-CM | POA: Insufficient documentation

## 2012-06-24 DIAGNOSIS — F319 Bipolar disorder, unspecified: Secondary | ICD-10-CM | POA: Insufficient documentation

## 2012-06-24 MED ORDER — FLUTICASONE PROPIONATE 50 MCG/ACT NA SUSP: 2.0000 | Freq: Every day | NASAL | Status: DC

## 2012-06-24 MED ORDER — ALBUTEROL SULFATE HFA 108 (90 BASE) MCG/ACT IN AERS
2.0000 | INHALATION_SPRAY | Freq: Four times a day (QID) | RESPIRATORY_TRACT | Status: DC
  Administered 2012-06-24: 2 via RESPIRATORY_TRACT
  Filled 2012-06-24: qty 6.7

## 2012-06-24 MED ORDER — HYDROCODONE-HOMATROPINE 5-1.5 MG/5ML PO SYRP: 5.0000 mL | ORAL_SOLUTION | Freq: Four times a day (QID) | ORAL | Status: AC | PRN

## 2012-06-24 NOTE — ED Provider Notes (Signed)
History     CSN: [id number]  Arrival date & time 06/24/12  1227   First MD Initiated Contact with Patient 06/24/12 1240      Chief Complaint  Patient presents with  . Sore Throat  . Cough  . URI    (Consider location/radiation/quality/duration/timing/severity/associated sxs/prior treatment) HPI Comments: Patient presents emergency department with upper respiratory type symptoms x3 weeks.  Symptoms include productive cough, nasal congestion, sinus pressure, and sore throat.  Patient has been taking over-the-counter medications without relief.  She denies any chest pain, shortness of breath, difficulty breathing, difficulty swallowing, fevers, night sweats, chills, myalgias, neck pain, wheezing.  Patient is a 26 y.o. female presenting with pharyngitis, cough, and URI. The history is provided by the patient.  Sore Throat Associated symptoms include congestion, coughing and a sore throat. Pertinent negatives include no abdominal pain, chest pain, chills, diaphoresis, fatigue, fever, headaches, numbness or weakness.  Cough Associated symptoms include rhinorrhea and sore throat. Pertinent negatives include no chest pain, no chills, no headaches, no shortness of breath and no wheezing.  URI The primary symptoms include sore throat and cough. Primary symptoms do not include fever, fatigue, headaches, wheezing or abdominal pain.  The sore throat is not accompanied by stridor.   Symptoms associated with the illness include congestion and rhinorrhea. The illness is not associated with chills.    Past Medical History  Diagnosis Date  . Depression   . Arthritis   . GERD (gastroesophageal reflux disease)   . Allergic rhinitis   . Migraine   . Vaginal delivery 2006  . Obese   . Bipolar 1 disorder     Past Surgical History  Procedure Date  . Anterior cruciate ligament repair 2009  . Cervical conization w/bx 01/31/2012    Procedure: CONIZATION CERVIX WITH BIOPSY;  Surgeon: [name], MD;  Location: [facility];  Service: Gynecology;  Laterality: N/A;  Cold Knife Conization of Cervix    Family History  Problem Relation Age of Onset  . Arthritis Other     both parents & grandparents  . Heart disease Other     grandparents  . Hyperlipidemia Other     grandparents  . Diabetes Paternal Aunt   . Hypertension Maternal Grandmother   . Anesthesia problems Neg Hx   . Hypotension Neg Hx   . Malignant hyperthermia Neg Hx   . Pseudochol deficiency Neg Hx     History  Substance Use Topics  . Smoking status: Current Everyday Smoker -- 1.0 packs/day for 8 years    Types: Cigarettes  . Smokeless tobacco: Never Used  . Alcohol Use: 0.0 oz/week     occasional    OB History    Grav Para Term Preterm Abortions TAB SAB Ect Mult Living                  Review of Systems  Constitutional: Negative for fever, chills, diaphoresis, appetite change and fatigue.  HENT: Positive for congestion, sore throat and rhinorrhea.   Eyes: Negative for visual disturbance.  Respiratory: Positive for cough. Negative for apnea, choking, chest tightness, shortness of breath, wheezing and stridor.   Cardiovascular: Negative for chest pain and leg swelling.  Gastrointestinal: Negative for abdominal pain.  Genitourinary: Negative for dysuria, urgency and frequency.  Neurological: Negative for dizziness, syncope, weakness, light-headedness, numbness and headaches.  Psychiatric/Behavioral: Negative for confusion.    Allergies  Raspberry  Home Medications   Current Outpatient Rx  Name Route Sig Dispense Refill  .  CYCLOBENZAPRINE HCL 5 MG PO TABS Oral Take 5 mg by mouth every 8 (eight) hours as needed. Muscle Spasms    . MELOXICAM 15 MG PO TABS Oral Take 1 tablet (15 mg total) by mouth daily. 30 tablet 2  . OVER THE COUNTER MEDICATION Oral Take 1 tablet by mouth once. Allergy medication=[facility]      BP 147/87  Pulse 87  Temp 98.3 F (36.8 C) (Oral)  Resp 18  SpO2 96%  LMP  12/26/2011  Physical Exam  Nursing note and vitals reviewed. Constitutional: She is oriented to person, place, and time. She appears well-developed and well-nourished. No distress.  HENT:  Head: Normocephalic and atraumatic. No trismus in the jaw.  Right Ear: Tympanic membrane, external ear and ear canal normal. No drainage or tenderness. No mastoid tenderness.  Left Ear: Tympanic membrane, external ear and ear canal normal. No drainage or tenderness. No mastoid tenderness.  Nose: Rhinorrhea present. No sinus tenderness. Right sinus exhibits no maxillary sinus tenderness and no frontal sinus tenderness. Left sinus exhibits no maxillary sinus tenderness and no frontal sinus tenderness.  Mouth/Throat: Uvula is midline, oropharynx is clear and moist and mucous membranes are normal. Normal dentition. No dental abscesses or uvula swelling. No oropharyngeal exudate, posterior oropharyngeal erythema or tonsillar abscesses.       No submental edema, tongue not elevated, no trismus. No impending airway obstruction; Pt able to speak full sentences, swallow intact, no drooling, stridor, or tonsillar/uvula displacement. No palatal petechia  Eyes: Conjunctivae and EOM are normal. Right eye exhibits no discharge. Left eye exhibits no discharge. No scleral icterus.  Neck: Trachea normal, normal range of motion and full passive range of motion without pain. Neck supple. No rigidity. No erythema and normal range of motion present. No Brudzinski's sign noted.       Flexion and extension of neck without pain or difficulty. Able to breath without difficulty in extension.  Cardiovascular: Normal rate, regular rhythm and normal heart sounds.   Pulmonary/Chest: Effort normal and breath sounds normal. No stridor. No respiratory distress. She has no wheezes. She exhibits no tenderness.       LCAB, no chest wall ttp  Abdominal: Soft. There is no tenderness.       No obvious evidence of splenomegaly. Non ttp.     Musculoskeletal: Normal range of motion.  Lymphadenopathy:       Head (right side): No preauricular and no posterior auricular adenopathy present.       Head (left side): No preauricular and no posterior auricular adenopathy present.    She has cervical adenopathy.  Neurological: She is alert and oriented to person, place, and time.  Skin: Skin is warm and dry. No rash noted. She is not diaphoretic.  Psychiatric: She has a normal mood and affect. Her behavior is normal.    ED Course  Procedures (including critical care time)  Labs Reviewed - No data to display Dg Chest 2 View  06/24/2012  *RADIOLOGY REPORT*  Clinical Data: Cough and chest congestion.  Shortness of breath.  CHEST - 2 VIEW  Comparison:  None.  Findings:  The heart size and mediastinal contours are within normal limits.  Both lungs are clear.  The visualized skeletal structures are unremarkable.  IMPRESSION: No active cardiopulmonary disease.  Original Report Authenticated By: Danae Orleans, M.D.     No diagnosis found.    MDM  URI  Pt CXR negative for acute infiltrate. Patients symptoms are consistent with URI, likely viral  etiology. Discussed that antibiotics are not indicated for viral infections. Pt will be discharged with symptomatic treatment.  Verbalizes understanding and is agreeable with plan. Pt is hemodynamically stable & in NAD prior to dc.         Jaci Carrel, [place] 06/24/12 1420

## 2012-06-24 NOTE — ED Provider Notes (Signed)
Medical screening examination/treatment/procedure(s) were performed by non-physician practitioner and as supervising physician I was immediately available for consultation/collaboration.  Cheri Guppy, MD 06/24/12 1537

## 2012-06-24 NOTE — ED Notes (Signed)
Pt reports cough, sore throat and URI symptoms x3 weeks.  Pt has not seen someone for symptoms. Pt reports productive cough. Pt denies fevers or pain. Pt has taken OTC allergy medication at home without relief.

## 2012-07-02 ENCOUNTER — Ambulatory Visit (INDEPENDENT_AMBULATORY_CARE_PROVIDER_SITE_OTHER): Payer: No Typology Code available for payment source | Admitting: Internal Medicine

## 2012-07-02 ENCOUNTER — Encounter: Payer: Self-pay | Admitting: Internal Medicine

## 2012-07-02 VITALS — BP 120/82 | HR 97 | Temp 98.5°F | Ht 69.0 in | Wt 300.4 lb

## 2012-07-02 DIAGNOSIS — G8929 Other chronic pain: Secondary | ICD-10-CM

## 2012-07-02 DIAGNOSIS — F17200 Nicotine dependence, unspecified, uncomplicated: Secondary | ICD-10-CM

## 2012-07-02 DIAGNOSIS — J209 Acute bronchitis, unspecified: Secondary | ICD-10-CM

## 2012-07-02 DIAGNOSIS — M549 Dorsalgia, unspecified: Secondary | ICD-10-CM

## 2012-07-02 MED ORDER — ALBUTEROL SULFATE HFA 108 (90 BASE) MCG/ACT IN AERS: 2.0000 | INHALATION_SPRAY | Freq: Four times a day (QID) | RESPIRATORY_TRACT | Status: DC | PRN

## 2012-07-02 MED ORDER — AMOXICILLIN-POT CLAVULANATE 875-125 MG PO TABS: 1.0000 | ORAL_TABLET | Freq: Two times a day (BID) | ORAL | Status: AC

## 2012-07-02 MED ORDER — CYCLOBENZAPRINE HCL 5 MG PO TABS: 5.0000 mg | ORAL_TABLET | Freq: Three times a day (TID) | ORAL | Status: DC | PRN

## 2012-07-02 MED ORDER — MELOXICAM 15 MG PO TABS: 15.0000 mg | ORAL_TABLET | Freq: Every day | ORAL | Status: DC

## 2012-07-02 NOTE — Assessment & Plan Note (Signed)
5 minutes today spent counseling patient on unhealthy effects of continued tobacco abuse and encouragement of cessation including medical options available to help the patient quit smoking. 

## 2012-07-02 NOTE — Progress Notes (Addendum)
Subjective:    Patient ID: Jacqueline Morrow, female    DOB: 09-04-1986, 26 y.o.   MRN: [id number]  HPI  Here for ER follow up - seen 7/14 for cough - dx URI, neg CXR No antibiotics prescribed Continued sinus pain/pressure, LGF and yellow green sputum with cough  also continued back pain - refill on meds?  Past Medical History  Diagnosis Date  . Depression   . Arthritis   . GERD (gastroesophageal reflux disease)   . Allergic rhinitis   . Migraine   . Vaginal delivery 2006  . Obese   . Bipolar 1 disorder     Review of Systems  Constitutional: Positive for fever and fatigue. Negative for unexpected weight change.  HENT: Positive for congestion and sinus pressure. Negative for ear pain, sneezing and postnasal drip.   Respiratory: Positive for cough, chest tightness and shortness of breath.   Cardiovascular: Negative for chest pain and leg swelling.  Neurological: Negative for headaches.       Objective:   Physical Exam BP 120/82  Pulse 97  Temp 98.5 F (36.9 C) (Oral)  Ht 5\' 9"  (1.753 m)  Wt 300 lb 6.4 oz (136.261 kg)  BMI 44.36 kg/m2  SpO2 94%  LMP 12/26/2011 Wt Readings from Last 3 Encounters:  07/02/12 300 lb 6.4 oz (136.261 kg)  01/25/12 280 lb (127.007 kg)  11/02/11 260 lb (117.935 kg)   Constitutional: She is obese, but appears well-developed and well-nourished. No distress.  HENT: Head: Normocephalic and atraumatic. Mild sinus tenderness L> maxillary area. Ears: B TMs ok, no erythema or effusion; Nose: Nose normal. Mouth/Throat: very poor dentition. Oropharynx is red but no oropharyngeal exudate.  Eyes: Conjunctivae and EOM are normal. Pupils are equal, round, and reactive to light. No scleral icterus.  Neck: Normal range of motion. Neck supple. No JVD present. No thyromegaly present.  Cardiovascular: Normal rate, regular rhythm and normal heart sounds.  No murmur heard. No BLE edema. Pulmonary/Chest: Effort normal and breath sounds with B rhonchi, few exp  wheeze L base. No respiratory distress.  Back: full range of motion of thoracic and lumbar spine. Non tender to palpation. Negative straight leg raise. DTR's are symmetrically intact. Sensation intact in all dermatomes of the lower extremities. Full strength to manual muscle testing. patient is able to heel toe walk without difficulty and ambulates with antalgic gait.  Psychiatric: She has a normal mood and affect. Her behavior is normal. Judgment and thought content normal.   Lab Results  Component Value Date   WBC 11.6* 01/25/2012   HGB 14.5 01/25/2012   HCT 42.0 01/25/2012   PLT 272 01/25/2012   GLUCOSE 101* 11/02/2011   ALT 59* 09/08/2011   AST 30 09/08/2011   NA 143 11/02/2011   K 3.6 11/02/2011   CL 107 11/02/2011   CREATININE 0.79 11/02/2011   BUN 9 11/02/2011   CO2 23 11/02/2011   TSH 1.23 09/08/2011   HGBA1C 5.4 09/08/2011   Dg Chest 2 View  06/24/2012  *RADIOLOGY REPORT*  Clinical Data: Cough and chest congestion.  Shortness of breath.  CHEST - 2 VIEW  Comparison:  None.  Findings:  The heart size and mediastinal contours are within normal limits.  Both lungs are clear.  The visualized skeletal structures are unremarkable.  IMPRESSION: No active cardiopulmonary disease.  Original Report Authenticated By: Danae Orleans, M.D.      Assessment & Plan:  acute bronchitis - ER visit 7/14 reviewed Tobacco abuse, ongoing  Will  prescribe Augmentin and Albuterol MDI - erx done Advised smoking cessation

## 2012-07-02 NOTE — Assessment & Plan Note (Signed)
No neuro deficits or Mskel abnormalities on exam Suspect strain from obesity and deconditioning Reviewed again risk of premature DDD and DJD related to obesity continue NSAIDs daily and muscle relaxers prn - refills today Encouraged back exercises for core strengthening

## 2012-07-02 NOTE — Patient Instructions (Signed)
It was good to see you today. We have reviewed your prior records including labs and tests today Augmentin antibiotics 2x/day x 1 week and Albuterol inhaler as needed for cough and shortness of breath  Your prescription(s) have been submitted to your pharmacy. Please take as directed and contact our office if you believe you are having problem(s) with the medication(s). continue to work on lifestyle changes as discussed (low fat, low carb, increased protein diet; improved exercise efforts; weight loss) to control sugar, blood pressure and cholesterol levels and/or reduce risk of developing other medical problems. Look into [employer]cy or other type of food journal to assist you in this process. Stop smoking - this is very bad for you and your loved ones health!   Back Exercises Back exercises help treat and prevent back injuries. The goal of back exercises is to increase the strength of your abdominal and back muscles and the flexibility of your back. These exercises should be started when you no longer have back pain. Back exercises include: 1. Pelvic Tilt - Lie on your back with your knees bent. Tilt your pelvis until the lower part of your back is against the floor. Hold this position 5-10 sec and repeat 5-10 times.  2. Knee to Chest - Pull first one knee up against your chest and hold for 20-30 seconds, repeat this with the other knee, and then both knees. This may be done with the other leg straight or bent, whichever feels better.  3. Sit-Ups or Curl-Ups - Bend your knees 90 degrees. Start with tilting your pelvis, and do a partial, slow sit-up, lifting your trunk only 30-45 degrees off the floor. Take at least 2-3 sec for each sit-up. Do not do sit-ups with your knees out straight. If partial sit-ups are difficult, simply do the above but with only tightening your abdominal muscles and holding it as directed.  4. Hip-Lift - Lie on your back with your knees flexed 90 degrees. Push down with  your feet and shoulders as you raise your hips a couple inches off the floor; hold for 10 sec, repeat 5-10 times.  5. Back arches - Lie on your stomach, propping yourself up on bent elbows. Slowly press on your hands, causing an arch in your low back. Repeat 3-5 times. Any initial stiffness and discomfort should lessen with repetition over time.  6. Shoulder-Lifts - Lie face down with arms beside your body. Keep hips and torso pressed to floor as you slowly lift your head and shoulders off the floor.  Do not overdo your exercises, especially in the beginning. Exercises may cause you some mild back discomfort which lasts for a few minutes; however, if the pain is more severe, or lasts for more than 15 minutes, do not continue exercises until you see your caregiver. Improvement with exercise therapy for back problems is slow.   See your caregivers for assistance with developing a proper back exercise program. Document Released: 01/05/2005 Document Re-Released: 02/24/2009 [facility] Patient Information 2011 [place], [place] .Acute Bronchitis You have acute bronchitis. This means you have a chest cold. The airways in your lungs are red and sore (inflamed). Acute means it is sudden onset.   CAUSES Bronchitis is most often caused by the same virus that causes a cold. SYMPTOMS    Body aches.   Chest congestion.   Chills.   Cough.   Fever.   Shortness of breath.   Sore throat.  TREATMENT   Acute bronchitis is usually treated with rest,  fluids, and medicines for relief of fever or cough. Most symptoms should go away after a few days or a week. Increased fluids may help thin your secretions and will prevent dehydration. Your caregiver may give you an inhaler to improve your symptoms. The inhaler reduces shortness of breath and helps control cough. You can take over-the-counter pain relievers or cough medicine to decrease coughing, pain, or fever. A cool-air vaporizer may help thin bronchial secretions  and make it easier to clear your chest. Antibiotics are usually not needed but can be prescribed if you smoke, are seriously ill, have chronic lung problems, are elderly, or you are at higher risk for developing complications. Allergies and asthma can make bronchitis worse. Repeated episodes of bronchitis may cause longstanding lung problems. Avoid smoking and secondhand smoke. Exposure to cigarette smoke or irritating chemicals will make bronchitis worse. If you are a cigarette smoker, consider using nicotine gum or skin patches to help control withdrawal symptoms. Quitting smoking will help your lungs heal faster. Recovery from bronchitis is often slow, but you should start feeling better after 2 to 3 days. Cough from bronchitis frequently lasts for 3 to 4 weeks. To prevent another bout of acute bronchitis:  Quit smoking.   Wash your hands frequently to get rid of viruses or use a hand sanitizer.   Avoid other people with cold or virus symptoms.   Try not to touch your hands to your mouth, nose, or eyes.  SEEK IMMEDIATE MEDICAL CARE IF:  You develop increased fever, chills, or chest pain.   You have severe shortness of breath or bloody sputum.   You develop dehydration, fainting, repeated vomiting, or a severe headache.   You have no improvement after 1 week of treatment or you get worse.  MAKE SURE YOU:    Understand these instructions.   Will watch your condition.   Will get help right away if you are not doing well or get worse.  Document Released: 01/05/2005 Document Revised: 11/17/2011 Document Reviewed: 03/23/2011 [facility] Patient Information 2012 [place], [place].

## 2012-10-10 ENCOUNTER — Telehealth: Payer: Self-pay | Admitting: *Deleted

## 2012-10-10 ENCOUNTER — Ambulatory Visit (INDEPENDENT_AMBULATORY_CARE_PROVIDER_SITE_OTHER): Payer: No Typology Code available for payment source | Admitting: Internal Medicine

## 2012-10-10 ENCOUNTER — Encounter: Payer: Self-pay | Admitting: Internal Medicine

## 2012-10-10 VITALS — BP 122/90 | HR 87 | Temp 98.6°F | Ht 69.0 in | Wt 301.0 lb

## 2012-10-10 DIAGNOSIS — F17200 Nicotine dependence, unspecified, uncomplicated: Secondary | ICD-10-CM

## 2012-10-10 DIAGNOSIS — E663 Overweight: Secondary | ICD-10-CM

## 2012-10-10 DIAGNOSIS — F319 Bipolar disorder, unspecified: Secondary | ICD-10-CM

## 2012-10-10 MED ORDER — LAMOTRIGINE 25 MG PO TABS: ORAL_TABLET | ORAL | Status: DC

## 2012-10-10 NOTE — Telephone Encounter (Signed)
Patient call back and stated pharmacy states they haven't received prescription that was sent in. Inform pt will fax [facility] to [facility].../[facility]

## 2012-10-10 NOTE — Progress Notes (Signed)
  Subjective:    Patient ID: Jacqueline Morrow, female    DOB: [birth year], 26 y.o.   MRN: [id number]  HPI   Here for follow up - requests renewal on prior Lamictal for bipolar dz Also needs referral for mental health   Past Medical History  Diagnosis Date  . Depression   . Arthritis   . GERD (gastroesophageal reflux disease)   . Allergic rhinitis   . Migraine   . Vaginal delivery 2006  . Obese   . Bipolar 1 disorder     Review of Systems  Constitutional: Positive for fatigue. Negative for unexpected weight change.  Respiratory: Negative for cough and shortness of breath.   Cardiovascular: Negative for chest pain and leg swelling.  Neurological: Negative for headaches.  Psychiatric/Behavioral: Positive for behavioral problems and dysphoric mood. Negative for self-injury. The patient is nervous/anxious.        Objective:   Physical Exam  BP 122/90  Pulse 87  Temp 98.6 F (37 C) (Oral)  Ht 5\' 9"  (1.753 m)  Wt 301 lb (136.533 kg)  BMI 44.45 kg/m2  SpO2 96% Wt Readings from Last 3 Encounters:  10/10/12 301 lb (136.533 kg)  07/02/12 300 lb 6.4 oz (136.261 kg)  01/25/12 280 lb (127.007 kg)   Constitutional: She is obese, but appears well-developed and well-nourished. No distress.  Neck: Normal range of motion. Neck supple. No JVD present. No thyromegaly present.  Cardiovascular: Normal rate, regular rhythm and normal heart sounds.  No murmur heard. No BLE edema. Pulmonary/Chest: Effort normal and breath sounds with B rhonchi, few exp wheeze L base. No respiratory distress.  Psychiatric: She has a normal mood and affect. Her behavior is normal. Judgment and thought content normal.   Lab Results  Component Value Date   WBC 11.6* 01/25/2012   HGB 14.5 01/25/2012   HCT 42.0 01/25/2012   PLT 272 01/25/2012   GLUCOSE 101* 11/02/2011   ALT 59* 09/08/2011   AST 30 09/08/2011   NA 143 11/02/2011   K 3.6 11/02/2011   CL 107 11/02/2011   CREATININE 0.79 11/02/2011   BUN 9  11/02/2011   CO2 23 11/02/2011   TSH 1.23 09/08/2011   HGBA1C 5.4 09/08/2011        Assessment & Plan:   See problem list. Medications and labs reviewed today.

## 2012-10-10 NOTE — Assessment & Plan Note (Signed)
5 minutes today spent counseling patient on unhealthy effects of continued tobacco abuse and encouragement of cessation including medical options available to help the patient quit smoking. 

## 2012-10-10 NOTE — Patient Instructions (Addendum)
It was good to see you today. Start Lamictal for bipolar disease: 25mg  daily x 2 weeks, then 50mg  daily x 2 weeks, then 75mg  daily x 2 weeks, then 100mg  daily - call when at 100mg  daily for new prescription of 100mg  tabs Your prescription(s) have been submitted to your pharmacy. Please take as directed and contact our office if you believe you are having problem(s) with the medication(s). we'll make referral to mental health provider to help you with other medication changes as needed. Our office will contact you regarding appointment(s) once made. If medication problems before this appointment, call here for additional adjustment as neededSmoking Cessation Quitting smoking is important to your health and has many advantages. However, it is not always easy to quit since nicotine is a very addictive drug. Often times, people try 3 times or more before being able to quit. This document explains the best ways for you to prepare to quit smoking. Quitting takes hard work and a lot of effort, but you can do it. ADVANTAGES OF QUITTING SMOKING  You will live longer, feel better, and live better.   Your body will feel the impact of quitting smoking almost immediately.   Within 20 minutes, blood pressure decreases. Your pulse returns to its normal level.   After 8 hours, carbon monoxide levels in the blood return to normal. Your oxygen level increases.   After 24 hours, the chance of having a heart attack starts to decrease. Your breath, hair, and body stop smelling like smoke.   After 48 hours, damaged nerve endings begin to recover. Your sense of taste and smell improve.   After 72 hours, the body is virtually free of nicotine. Your bronchial tubes relax and breathing becomes easier.   After 2 to 12 weeks, lungs can hold more air. Exercise becomes easier and circulation improves.   The risk of having a heart attack, stroke, cancer, or lung disease is greatly reduced.   After 1 year, the risk of  coronary heart disease is cut in half.   After 5 years, the risk of stroke falls to the same as a nonsmoker.   After 10 years, the risk of lung cancer is cut in half and the risk of other cancers decreases significantly.   After 15 years, the risk of coronary heart disease drops, usually to the level of a nonsmoker.   If you are pregnant, quitting smoking will improve your chances of having a healthy baby.   The people you live with, especially any children, will be healthier.   You will have extra money to spend on things other than cigarettes.  QUESTIONS TO THINK ABOUT BEFORE ATTEMPTING TO QUIT You may want to talk about your answers with your caregiver.  Why do you want to quit?   If you tried to quit in the past, what helped and what did not?   What will be the most difficult situations for you after you quit? How will you plan to handle them?   Who can help you through the tough times? Your family? Friends? A caregiver?   What pleasures do you get from smoking? What ways can you still get pleasure if you quit?  Here are some questions to ask your caregiver:  How can you help me to be successful at quitting?   What medicine do you think would be best for me and how should I take it?   What should I do if I need more help?   What is  smoking withdrawal like? How can I get information on withdrawal?  GET READY  Set a quit date.   Change your environment by getting rid of all cigarettes, ashtrays, matches, and lighters in your home, car, or work. Do not let people smoke in your home.   Review your past attempts to quit. Think about what worked and what did not.  GET SUPPORT AND ENCOURAGEMENT You have a better chance of being successful if you have help. You can get support in many ways.  Tell your family, friends, and co-workers that you are going to quit and need their support. Ask them not to smoke around you.   Get individual, group, or telephone counseling and  support. Programs are available at [employer] and health centers. Call your local health department for information about programs in your area.   Spiritual beliefs and practices may help some smokers quit.   Download a "quit meter" on your computer to keep track of quit statistics, such as how long you have gone without smoking, cigarettes not smoked, and money saved.   Get a self-help book about quitting smoking and staying off of tobacco.  LEARN NEW SKILLS AND BEHAVIORS  Distract yourself from urges to smoke. Talk to someone, go for a walk, or occupy your time with a task.   Change your normal routine. Take a different route to work. Drink tea instead of coffee. Eat breakfast in a different place.   Reduce your stress. Take a hot bath, exercise, or read a book.   Plan something enjoyable to do every day. Reward yourself for not smoking.   Explore interactive web-based programs that specialize in helping you quit.  GET MEDICINE AND USE IT CORRECTLY Medicines can help you stop smoking and decrease the urge to smoke. Combining medicine with the above behavioral methods and support can greatly increase your chances of successfully quitting smoking.  Nicotine replacement therapy helps deliver nicotine to your body without the negative effects and risks of smoking. Nicotine replacement therapy includes nicotine gum, lozenges, inhalers, nasal sprays, and skin patches. Some may be available over-the-counter and others require a prescription.   Antidepressant medicine helps people abstain from smoking, but how this works is unknown. This medicine is available by prescription.   Nicotinic receptor partial agonist medicine simulates the effect of nicotine in your brain. This medicine is available by prescription.  Ask your caregiver for advice about which medicines to use and how to use them based on your health history. Your caregiver will tell you what side effects to look out for if you  choose to be on a medicine or therapy. Carefully read the information on the package. Do not use any other product containing nicotine while using a nicotine replacement product.   RELAPSE OR DIFFICULT SITUATIONS Most relapses occur within the first 3 months after quitting. Do not be discouraged if you start smoking again. Remember, most people try several times before finally quitting. You may have symptoms of withdrawal because your body is used to nicotine. You may crave cigarettes, be irritable, feel very hungry, cough often, get headaches, or have difficulty concentrating. The withdrawal symptoms are only temporary. They are strongest when you first quit, but they will go away within 10 14 days. To reduce the chances of relapse, try to:  Avoid drinking alcohol. Drinking lowers your chances of successfully quitting.   Reduce the amount of caffeine you consume. Once you quit smoking, the amount of caffeine in your body increases and  can give you symptoms, such as a rapid heartbeat, sweating, and anxiety.   Avoid smokers because they can make you want to smoke.   Do not let weight gain distract you. Many smokers will gain weight when they quit, usually less than 10 pounds. Eat a healthy diet and stay active. You can always lose the weight gained after you quit.   Find ways to improve your mood other than smoking.  FOR MORE INFORMATION   www.smokefree.gov   Document Released: 11/22/2001 Document Revised: 05/29/2012 Document Reviewed: 03/08/2012 [facility] Patient Information 2013 [place], [place].

## 2012-10-10 NOTE — Assessment & Plan Note (Signed)
Resume Lamictal - titrate to effective dose slowly Refer to mental health prescribing provider we reviewed potential risk/benefit and possible side effects - pt understands and agrees to same - she will call if problems

## 2012-10-10 NOTE — Assessment & Plan Note (Signed)
Wt Readings from Last 3 Encounters:  10/10/12 301 lb (136.533 kg)  07/02/12 300 lb 6.4 oz (136.261 kg)  01/25/12 280 lb (127.007 kg)   Weight is primary obstacle to health at this time Reviewed potential health risks including diabetes mellitus, dyslipidemia, HTN and osteoarthritis  Encouraged healthy lifestyle changes to include increase aerobic activity and heart healthy choices The patient is asked to make an attempt to improve diet and exercise patterns to aid in medical management of this problem.

## 2012-10-15 ENCOUNTER — Other Ambulatory Visit (HOSPITAL_COMMUNITY)
Admission: RE | Admit: 2012-10-15 | Discharge: 2012-10-15 | Disposition: A | Discharge status: Home / Self Care | Payer: No Typology Code available for payment source | Source: Ambulatory Visit | Attending: Obstetrics and Gynecology | Admitting: Obstetrics and Gynecology

## 2012-10-15 ENCOUNTER — Other Ambulatory Visit: Payer: Self-pay | Admitting: Obstetrics and Gynecology

## 2012-10-15 DIAGNOSIS — Z01419 Encounter for gynecological examination (general) (routine) without abnormal findings: Secondary | ICD-10-CM | POA: Insufficient documentation

## 2013-06-12 ENCOUNTER — Other Ambulatory Visit: Payer: Self-pay | Admitting: *Deleted

## 2013-06-13 ENCOUNTER — Other Ambulatory Visit: Payer: Self-pay | Admitting: *Deleted

## 2013-06-13 MED ORDER — NORGESTIMATE-ETH ESTRADIOL 0.25-35 MG-MCG PO TABS: 1.0000 | ORAL_TABLET | Freq: Every day | ORAL | Status: DC

## 2014-03-10 ENCOUNTER — Emergency Department (HOSPITAL_COMMUNITY): Payer: Private Health Insurance - Indemnity

## 2014-03-10 ENCOUNTER — Emergency Department (HOSPITAL_COMMUNITY)
Admission: EM | Admit: 2014-03-10 | Discharge: 2014-03-10 | Disposition: A | Discharge status: Home / Self Care | Payer: Private Health Insurance - Indemnity | Attending: Emergency Medicine | Admitting: Emergency Medicine

## 2014-03-10 DIAGNOSIS — E669 Obesity, unspecified: Secondary | ICD-10-CM | POA: Insufficient documentation

## 2014-03-10 DIAGNOSIS — Y9389 Activity, other specified: Secondary | ICD-10-CM | POA: Insufficient documentation

## 2014-03-10 DIAGNOSIS — X500XXA Overexertion from strenuous movement or load, initial encounter: Secondary | ICD-10-CM | POA: Insufficient documentation

## 2014-03-10 DIAGNOSIS — G43909 Migraine, unspecified, not intractable, without status migrainosus: Secondary | ICD-10-CM | POA: Insufficient documentation

## 2014-03-10 DIAGNOSIS — Z8659 Personal history of other mental and behavioral disorders: Secondary | ICD-10-CM | POA: Insufficient documentation

## 2014-03-10 DIAGNOSIS — F17200 Nicotine dependence, unspecified, uncomplicated: Secondary | ICD-10-CM | POA: Insufficient documentation

## 2014-03-10 DIAGNOSIS — M129 Arthropathy, unspecified: Secondary | ICD-10-CM | POA: Insufficient documentation

## 2014-03-10 DIAGNOSIS — Z8719 Personal history of other diseases of the digestive system: Secondary | ICD-10-CM | POA: Insufficient documentation

## 2014-03-10 DIAGNOSIS — IMO0002 Reserved for concepts with insufficient information to code with codable children: Secondary | ICD-10-CM | POA: Insufficient documentation

## 2014-03-10 DIAGNOSIS — Y92009 Unspecified place in unspecified non-institutional (private) residence as the place of occurrence of the external cause: Secondary | ICD-10-CM | POA: Insufficient documentation

## 2014-03-10 DIAGNOSIS — S8392XA Sprain of unspecified site of left knee, initial encounter: Secondary | ICD-10-CM

## 2014-03-10 MED ORDER — NAPROXEN 500 MG PO TABS: 500.0000 mg | ORAL_TABLET | Freq: Two times a day (BID) | ORAL | Status: DC

## 2014-03-10 NOTE — ED Provider Notes (Signed)
CSN: [id number]     Arrival date & time 03/10/14  1730 History  This chart was scribed for non-physician practitioner, Jacqueline Fordyce, PA-C, working with Jasper Riling. Alvino Chapel, MD by Jacqueline Morrow, ED Scribe. This patient was seen in room WTR7/WTR7 and the patient's care was started at 7:24 PM.   Chief Complaint  Patient presents with  . Knee Pain    The history is provided by the patient. No language interpreter was used.    HPI Comments: Jacqueline Morrow is a 28 y.o. female who presents to the Emergency Department complaining of sharp and throbbing, posterior left knee pain onset 4-5 days ago. She rates pain as 5-6/10. Pain is worse with extension of her knee. Patient states that she was playing around with her friend at home and the next day she started having pain. She noticed some associated swelling last night that has improved today. She has not taken any medications at home for pain relief. She denies history of previous injury to her left knee. She denies numbness or weakness to the extremities.   Past Medical History  Diagnosis Date  . Depression   . Arthritis   . GERD (gastroesophageal reflux disease)   . Allergic rhinitis   . Migraine   . Vaginal delivery 2006  . Obese   . Bipolar 1 disorder    Past Surgical History  Procedure Laterality Date  . Anterior cruciate ligament repair  2009  . Cervical conization w/bx  01/31/2012    Procedure: CONIZATION CERVIX WITH BIOPSY;  Surgeon: Jonnie Kind, MD;  Location: [facility];  Service: Gynecology;  Laterality: N/A;  Cold Knife Conization of Cervix   Family History  Problem Relation Age of Onset  . Arthritis Other     both parents & grandparents  . Heart disease Other     grandparents  . Hyperlipidemia Other     grandparents  . Diabetes Paternal Aunt   . Hypertension Maternal Grandmother   . Anesthesia problems Neg Hx   . Hypotension Neg Hx   . Malignant hyperthermia Neg Hx   . Pseudochol deficiency Neg Hx    History   Substance Use Topics  . Smoking status: Current Every Day Smoker -- 1.00 packs/day for 8 years    Types: Cigarettes  . Smokeless tobacco: Never Used  . Alcohol Use: 0.0 oz/week     Comment: occasional   OB History   Grav Para Term Preterm Abortions TAB SAB Ect Mult Living                 Review of Systems  Musculoskeletal: Positive for arthralgias.  Neurological: Negative for weakness and numbness.    Allergies  Raspberry  Home Medications   Current Outpatient Rx  Name  Route  Sig  Dispense  Refill  . ibuprofen (ADVIL,MOTRIN) 200 MG tablet   Oral   Take 200 mg by mouth every 6 (six) hours as needed for mild pain.         Marland Kitchen EXPIRED: albuterol (PROVENTIL HFA;VENTOLIN HFA) 108 (90 BASE) MCG/ACT inhaler   Inhalation   Inhale 2 puffs into the lungs every 6 (six) hours as needed for wheezing.   1 Inhaler   0   . EXPIRED: cyclobenzaprine (FLEXERIL) 5 MG tablet   Oral   Take 1 tablet (5 mg total) by mouth every 8 (eight) hours as needed for muscle spasms. Muscle Spasms   30 tablet   2   . EXPIRED: fluticasone (FLONASE) 50 MCG/ACT  nasal spray   Nasal   Place 2 sprays into the nose daily.   16 g   2   . EXPIRED: meloxicam (MOBIC) 15 MG tablet   Oral   Take 1 tablet (15 mg total) by mouth daily.   30 tablet   2    Triage Vitals: BP 142/90  Pulse 90  Temp(Src) 99 F (37.2 C) (Oral)  SpO2 96% Physical Exam  Nursing note and vitals reviewed. Constitutional: She is oriented to person, place, and time. She appears well-developed and well-nourished.  HENT:  Head: Normocephalic and atraumatic.  Eyes: EOM are normal. Pupils are equal, round, and reactive to light.  Neck: Normal range of motion. Neck supple.  Cardiovascular: Normal rate, normal heart sounds and intact distal pulses.   Pedal pulses 2+  Pulmonary/Chest: Effort normal and breath sounds normal.  Abdominal: Bowel sounds are normal. She exhibits no distension. There is no tenderness.  Musculoskeletal:  Normal range of motion. She exhibits tenderness ( posterior left knee). She exhibits no edema.  Left knee. FROM. No crepitus. Antalgic gait.  Neurological: She is alert and oriented to person, place, and time. She has normal strength. No cranial nerve deficit or sensory deficit.  Sensation in tact to left lower leg.  Skin: Skin is warm and dry. No rash noted.  Skin in tact. No ecchymosis, erythema, or warmth. No red streaking, induration, or evidence of underlying infection  Psychiatric: She has a normal mood and affect.    ED Course  Procedures (including critical care time) DIAGNOSTIC STUDIES: Oxygen Saturation is 96% on room air, adequate by my interpretation.    COORDINATION OF CARE: 7:29 PM- X-ray negative. Suspect left knee sprain. Will order knee sleeve nd prescribe naproxen. Advised patient to elevate knee at home. Patient informed of current plan for treatment and evaluation and agrees with plan at this time.    Imaging Review Dg Knee Complete 4 Views Left  03/10/2014   CLINICAL DATA:  Injured left knee 4 days ago.  Posterior knee pain.  EXAM: LEFT KNEE - COMPLETE 4+ VIEW  COMPARISON:  None.  FINDINGS: There is no evidence of fracture, dislocation, or joint effusion. There is no evidence of arthropathy or other focal bone abnormality. Soft tissues are unremarkable.  IMPRESSION: Negative.   Electronically Signed   By: [name] M.D.   On: 03/10/2014 18:17    MDM   Final diagnoses:  Left knee sprain    Pt c/o left knee pain. No evidence of knee swelling, ecchymosis, or signs of infection. Pt has antalgic gait but able to bear weight. No calf tenderness. Neurovascularly in tact. Plain films-negative. Will tx symptomatically. Rx: knee sleeve and naproxen as pt has not tried acetaminophen or ibuprofen.  Advised to f/u with PCP and [facility] as needed for continued pain. Pt verbalized understanding and agreement with tx plan.  I personally performed the services  described in this documentation, which was scribed in my presence. The recorded information has been reviewed and is accurate.    Jacqueline Fordyce, PA-C 03/10/14 1940

## 2014-03-10 NOTE — ED Notes (Signed)
Pt states she has had L knee pain x 4-5 days. Does not recall a specific injury but states it could have been when she was playing around the house. Alert and oriented. Hx of torn ACL on R side.

## 2014-03-11 NOTE — ED Provider Notes (Signed)
Medical screening examination/treatment/procedure(s) were performed by non-physician practitioner and as supervising physician I was immediately available for consultation/collaboration.   EKG Interpretation None       Jasper Riling. Alvino Chapel, MD 03/11/14 2979

## 2014-07-23 ENCOUNTER — Encounter (HOSPITAL_COMMUNITY): Payer: Self-pay | Admitting: Emergency Medicine

## 2014-07-23 ENCOUNTER — Emergency Department (HOSPITAL_COMMUNITY)
Admission: EM | Admit: 2014-07-23 | Discharge: 2014-07-24 | Disposition: A | Discharge status: Home / Self Care | Payer: Private Health Insurance - Indemnity | Attending: Emergency Medicine | Admitting: Emergency Medicine

## 2014-07-23 DIAGNOSIS — Z8709 Personal history of other diseases of the respiratory system: Secondary | ICD-10-CM | POA: Insufficient documentation

## 2014-07-23 DIAGNOSIS — M129 Arthropathy, unspecified: Secondary | ICD-10-CM | POA: Insufficient documentation

## 2014-07-23 DIAGNOSIS — Z8719 Personal history of other diseases of the digestive system: Secondary | ICD-10-CM | POA: Insufficient documentation

## 2014-07-23 DIAGNOSIS — Z3202 Encounter for pregnancy test, result negative: Secondary | ICD-10-CM | POA: Insufficient documentation

## 2014-07-23 DIAGNOSIS — R1030 Lower abdominal pain, unspecified: Secondary | ICD-10-CM

## 2014-07-23 DIAGNOSIS — R109 Unspecified abdominal pain: Secondary | ICD-10-CM | POA: Insufficient documentation

## 2014-07-23 DIAGNOSIS — F17200 Nicotine dependence, unspecified, uncomplicated: Secondary | ICD-10-CM | POA: Insufficient documentation

## 2014-07-23 DIAGNOSIS — Z791 Long term (current) use of non-steroidal anti-inflammatories (NSAID): Secondary | ICD-10-CM | POA: Insufficient documentation

## 2014-07-23 DIAGNOSIS — IMO0002 Reserved for concepts with insufficient information to code with codable children: Secondary | ICD-10-CM | POA: Insufficient documentation

## 2014-07-23 DIAGNOSIS — Z79899 Other long term (current) drug therapy: Secondary | ICD-10-CM | POA: Insufficient documentation

## 2014-07-23 DIAGNOSIS — Z8659 Personal history of other mental and behavioral disorders: Secondary | ICD-10-CM | POA: Insufficient documentation

## 2014-07-23 LAB — POC URINE PREG, ED: Preg Test, Ur: NEGATIVE

## 2014-07-23 NOTE — ED Provider Notes (Signed)
CSN: [id number]     Arrival date & time 07/23/14  2052 History   First MD Initiated Contact with Patient 07/23/14 2307     Chief Complaint  Patient presents with  . Abdominal Pain     (Consider location/radiation/quality/duration/timing/severity/associated sxs/prior Treatment) HPI  28 year old obese female with history of bipolar, GERD, depression presents complaining of lower abdominal pain. Patient reports for the past 2-3 weeks she has had persistent low abnormal discomfort which she described as a pressure sensation worsening with laying down the back of her legs and on the abdomen. Pain is progressive. Patient felt pain is similar to prior pregnancy. She is a G2 P1. Has history of irregular menstrual periods and her last period was more than a year ago. She is sexually active. She denies any pain with sexual activities. Denies any fever, chills, headache, chest pain, shortness of breath, productive cough, back pain, dysuria, hematuria, hematochezia melena. She denies any vaginal discharge or rash. Eating and drinking does not affect her pain. She does have a primary care Dr. but has not followup with.   Past Medical History  Diagnosis Date  . Depression   . Arthritis   . GERD (gastroesophageal reflux disease)   . Allergic rhinitis   . Migraine   . Vaginal delivery 2006  . Obese   . Bipolar 1 disorder    Past Surgical History  Procedure Laterality Date  . Anterior cruciate ligament repair  2009  . Cervical conization w/bx  01/31/2012    Procedure: CONIZATION CERVIX WITH BIOPSY;  Surgeon: Jonnie Kind, MD;  Location: [facility];  Service: Gynecology;  Laterality: N/A;  Cold Knife Conization of Cervix   Family History  Problem Relation Age of Onset  . Arthritis Other     both parents & grandparents  . Heart disease Other     grandparents  . Hyperlipidemia Other     grandparents  . Diabetes Paternal Aunt   . Hypertension Maternal Grandmother   . Anesthesia problems Neg Hx   .  Hypotension Neg Hx   . Malignant hyperthermia Neg Hx   . Pseudochol deficiency Neg Hx    History  Substance Use Topics  . Smoking status: Current Every Day Smoker -- 1.00 packs/day for 8 years    Types: Cigarettes  . Smokeless tobacco: Never Used  . Alcohol Use: 0.0 oz/week     Comment: occasional   OB History   Grav Para Term Preterm Abortions TAB SAB Ect Mult Living                 Review of Systems  All other systems reviewed and are negative.     Allergies  Raspberry  Home Medications   Prior to Admission medications   Medication Sig Start Date End Date Taking? Authorizing Provider  albuterol (PROVENTIL HFA;VENTOLIN HFA) 108 (90 BASE) MCG/ACT inhaler Inhale 2 puffs into the lungs every 6 (six) hours as needed for wheezing. 07/02/12 07/02/13  Rowe Clack, MD  cyclobenzaprine (FLEXERIL) 5 MG tablet Take 1 tablet (5 mg total) by mouth every 8 (eight) hours as needed for muscle spasms. Muscle Spasms 07/02/12 07/02/13  Rowe Clack, MD  fluticasone (FLONASE) 50 MCG/ACT nasal spray Place 2 sprays into the nose daily. 06/24/12 06/24/13  Lisette Paz, PA-C  ibuprofen (ADVIL,MOTRIN) 200 MG tablet Take 200 mg by mouth every 6 (six) hours as needed for mild pain.    Historical Provider, MD  meloxicam (MOBIC) 15 MG tablet Take 1 tablet (15  mg total) by mouth daily. 07/02/12 07/02/13  Rowe Clack, MD  naproxen (NAPROSYN) 500 MG tablet Take 1 tablet (500 mg total) by mouth 2 (two) times daily with a meal. Take twice daily with a meal for 10 days, then as needed for pain. 03/10/14   Noland Fordyce, PA-C   BP 143/71  Pulse 88  Temp(Src) 98.3 F (36.8 C) (Oral)  Resp 18  SpO2 97% Physical Exam  Nursing note and vitals reviewed. Constitutional: She appears well-developed and well-nourished. No distress.  Morbidly obese caucasian female appears in no acute distress.   HENT:  Head: Atraumatic.  Eyes: Conjunctivae are normal.  Neck: Neck supple.  Cardiovascular: Normal rate  and regular rhythm.   Pulmonary/Chest: Effort normal and breath sounds normal. No respiratory distress. She exhibits no tenderness.  Abdominal: Soft. Bowel sounds are normal. She exhibits no distension. There is tenderness (mild suprapubig tenderness without guarding or rebound tenderness.  ). There is no rebound and no guarding.  Genitourinary:  No cva tenderness.  Neurological: She is alert.  Skin: No rash noted.  Psychiatric: She has a normal mood and affect.    ED Course  Procedures (including critical care time)  11:48 PM Persistent lower abd pain.  Non surgical abdomen on exam.  Given that she has not had a period for 1 year and concerning of pregnancy, will check UA and preg test.    12:15 AM Urinalysis and pregnancy tests are unremarkable. I did offer patient for a pelvic examination however patient declined stating that she has been with the same sexual partners the past 3 years. I recommend for patient to followup with her PCP for further evaluation. Patient agrees. Standard return precautions discussed. Patient is currently stable for discharge.  Labs Review Labs Reviewed  URINALYSIS, ROUTINE W REFLEX MICROSCOPIC  POC URINE PREG, ED    Imaging Review No results found.   EKG Interpretation None      MDM   Final diagnoses:  Lower abdominal pain    BP 143/71  Pulse 88  Temp(Src) 98.3 F (36.8 C) (Oral)  Resp 18  SpO2 97%     Domenic Moras, PA-C 07/24/14 0017

## 2014-07-23 NOTE — ED Notes (Signed)
Pt reports low abd pain x 3 weeks.  Denies any n/v, urinary sxs or vaginal d/c at this time.

## 2014-07-24 LAB — URINALYSIS, ROUTINE W REFLEX MICROSCOPIC
BILIRUBIN URINE: NEGATIVE
Glucose, UA: NEGATIVE mg/dL
HGB URINE DIPSTICK: NEGATIVE
KETONES UR: NEGATIVE mg/dL
Leukocytes, UA: NEGATIVE
NITRITE: NEGATIVE
PH: 5.5 (ref 5.0–8.0)
Protein, ur: NEGATIVE mg/dL
Specific Gravity, Urine: 1.026 (ref 1.005–1.030)
Urobilinogen, UA: 0.2 mg/dL (ref 0.0–1.0)

## 2014-07-24 NOTE — Discharge Instructions (Signed)
Abdominal Pain, Women °Abdominal (stomach, pelvic, or belly) pain can be caused by many things. It is important to tell your doctor: °· The location of the pain. °· Does it come and go or is it present all the time? °· Are there things that start the pain (eating certain foods, exercise)? °· Are there other symptoms associated with the pain (fever, nausea, vomiting, diarrhea)? °All of this is helpful to know when trying to find the cause of the pain. °CAUSES  °· Stomach: virus or bacteria infection, or ulcer. °· Intestine: appendicitis (inflamed appendix), regional ileitis (Crohn's disease), ulcerative colitis (inflamed colon), irritable bowel syndrome, diverticulitis (inflamed diverticulum of the colon), or cancer of the stomach or intestine. °· Gallbladder disease or stones in the gallbladder. °· Kidney disease, kidney stones, or infection. °· Pancreas infection or cancer. °· Fibromyalgia (pain disorder). °· Diseases of the female organs: °¨ Uterus: fibroid (non-cancerous) tumors or infection. °¨ Fallopian tubes: infection or tubal pregnancy. °¨ Ovary: cysts or tumors. °¨ Pelvic adhesions (scar tissue). °¨ Endometriosis (uterus lining tissue growing in the pelvis and on the pelvic organs). °¨ Pelvic congestion syndrome (female organs filling up with blood just before the menstrual period). °¨ Pain with the menstrual period. °¨ Pain with ovulation (producing an egg). °¨ Pain with an IUD (intrauterine device, birth control) in the uterus. °¨ Cancer of the female organs. °· Functional pain (pain not caused by a disease, may improve without treatment). °· Psychological pain. °· Depression. °DIAGNOSIS  °Your doctor will decide the seriousness of your pain by doing an examination. °· Blood tests. °· X-rays. °· Ultrasound. °· CT scan (computed tomography, special type of X-ray). °· MRI (magnetic resonance imaging). °· Cultures, for infection. °· Barium enema (dye inserted in the large intestine, to better view it with  X-rays). °· Colonoscopy (looking in intestine with a lighted tube). °· Laparoscopy (minor surgery, looking in abdomen with a lighted tube). °· Major abdominal exploratory surgery (looking in abdomen with a large incision). °TREATMENT  °The treatment will depend on the cause of the pain.  °· Many cases can be observed and treated at home. °· Over-the-counter medicines recommended by your caregiver. °· Prescription medicine. °· Antibiotics, for infection. °· Birth control pills, for painful periods or for ovulation pain. °· Hormone treatment, for endometriosis. °· Nerve blocking injections. °· Physical therapy. °· Antidepressants. °· Counseling with a psychologist or psychiatrist. °· Minor or major surgery. °HOME CARE INSTRUCTIONS  °· Do not take laxatives, unless directed by your caregiver. °· Take over-the-counter pain medicine only if ordered by your caregiver. Do not take aspirin because it can cause an upset stomach or bleeding. °· Try a clear liquid diet (broth or water) as ordered by your caregiver. Slowly move to a bland diet, as tolerated, if the pain is related to the stomach or intestine. °· Have a thermometer and take your temperature several times a day, and record it. °· Bed rest and sleep, if it helps the pain. °· Avoid sexual intercourse, if it causes pain. °· Avoid stressful situations. °· Keep your follow-up appointments and tests, as your caregiver orders. °· If the pain does not go away with medicine or surgery, you may try: °¨ Acupuncture. °¨ Relaxation exercises (yoga, meditation). °¨ Group therapy. °¨ Counseling. °SEEK MEDICAL CARE IF:  °· You notice certain foods cause stomach pain. °· Your home care treatment is not helping your pain. °· You need stronger pain medicine. °· You want your IUD removed. °· You feel faint or   lightheaded. °· You develop nausea and vomiting. °· You develop a rash. °· You are having side effects or an allergy to your medicine. °SEEK IMMEDIATE MEDICAL CARE IF:  °· Your  pain does not go away or gets worse. °· You have a fever. °· Your pain is felt only in portions of the abdomen. The right side could possibly be appendicitis. The left lower portion of the abdomen could be colitis or diverticulitis. °· You are passing blood in your stools (bright red or black tarry stools, with or without vomiting). °· You have blood in your urine. °· You develop chills, with or without a fever. °· You pass out. °MAKE SURE YOU:  °· Understand these instructions. °· Will watch your condition. °· Will get help right away if you are not doing well or get worse. °Document Released: 09/25/2007 Document Revised: 04/14/2014 Document Reviewed: 10/15/2009 °[employer]® Patient Information ©2015 [employer]. This information is not intended to replace advice given to you by your health care provider. Make sure you discuss any questions you have with your health care provider. ° °

## 2014-07-24 NOTE — ED Notes (Signed)
Pt left w/o discharge papers. No prescriptions.

## 2014-07-25 NOTE — ED Provider Notes (Signed)
Medical screening examination/treatment/procedure(s) were performed by non-physician practitioner and as supervising physician I was immediately available for consultation/collaboration.   EKG Interpretation None       Varney Biles, MD 07/25/14 [phone]

## 2014-12-25 ENCOUNTER — Encounter (HOSPITAL_COMMUNITY): Payer: Self-pay | Admitting: Obstetrics and Gynecology

## 2015-07-26 DIAGNOSIS — D3910 Neoplasm of uncertain behavior of unspecified ovary: Secondary | ICD-10-CM | POA: Insufficient documentation

## 2017-05-14 DIAGNOSIS — F419 Anxiety disorder, unspecified: Secondary | ICD-10-CM | POA: Insufficient documentation

## 2018-07-01 ENCOUNTER — Encounter (HOSPITAL_COMMUNITY): Payer: Self-pay | Admitting: Emergency Medicine

## 2018-07-01 ENCOUNTER — Other Ambulatory Visit: Payer: Self-pay

## 2018-07-01 ENCOUNTER — Emergency Department (HOSPITAL_COMMUNITY)
Admission: EM | Admit: 2018-07-01 | Discharge: 2018-07-01 | Disposition: A | Discharge status: Home / Self Care | Payer: Self-pay | Attending: Emergency Medicine | Admitting: Emergency Medicine

## 2018-07-01 DIAGNOSIS — A599 Trichomoniasis, unspecified: Secondary | ICD-10-CM | POA: Insufficient documentation

## 2018-07-01 DIAGNOSIS — Z87891 Personal history of nicotine dependence: Secondary | ICD-10-CM | POA: Insufficient documentation

## 2018-07-01 LAB — URINALYSIS, ROUTINE W REFLEX MICROSCOPIC
Bilirubin Urine: NEGATIVE
Glucose, UA: NEGATIVE mg/dL
KETONES UR: NEGATIVE mg/dL
Nitrite: POSITIVE — AB
PH: 5 (ref 5.0–8.0)
Protein, ur: NEGATIVE mg/dL
Specific Gravity, Urine: 1.019 (ref 1.005–1.030)

## 2018-07-01 LAB — WET PREP, GENITAL
Sperm: NONE SEEN
Yeast Wet Prep HPF POC: NONE SEEN

## 2018-07-01 LAB — RAPID HIV SCREEN (HIV 1/2 AB+AG)
HIV 1/2 Antibodies: NONREACTIVE
HIV-1 P24 Antigen - HIV24: NONREACTIVE

## 2018-07-01 LAB — POC URINE PREG, ED: PREG TEST UR: NEGATIVE

## 2018-07-01 MED ORDER — ONDANSETRON 4 MG PO TBDP
4.0000 mg | ORAL_TABLET | Freq: Once | ORAL | Status: AC
  Administered 2018-07-01: 4 mg via ORAL
  Filled 2018-07-01: qty 1

## 2018-07-01 MED ORDER — CEPHALEXIN 500 MG PO CAPS: 500.0000 mg | ORAL_CAPSULE | Freq: Two times a day (BID) | ORAL | 0 refills | Status: AC

## 2018-07-01 MED ORDER — METRONIDAZOLE 500 MG PO TABS: 500.0000 mg | ORAL_TABLET | Freq: Two times a day (BID) | ORAL | 0 refills | Status: DC

## 2018-07-01 MED ORDER — STERILE WATER FOR INJECTION IJ SOLN
0.9000 mL | Freq: Once | INTRAMUSCULAR | Status: AC
  Administered 2018-07-01: 0.9 mL via INTRAMUSCULAR

## 2018-07-01 MED ORDER — STERILE WATER FOR INJECTION IJ SOLN
INTRAMUSCULAR | Status: DC
Start: 2018-07-01 — End: 2018-07-02
  Filled 2018-07-01: qty 10

## 2018-07-01 MED ORDER — METRONIDAZOLE 500 MG PO TABS
2000.0000 mg | ORAL_TABLET | Freq: Once | ORAL | Status: AC
  Administered 2018-07-01: 2000 mg via ORAL
  Filled 2018-07-01: qty 4

## 2018-07-01 MED ORDER — CEFTRIAXONE SODIUM 250 MG IJ SOLR
250.0000 mg | Freq: Once | INTRAMUSCULAR | Status: AC
  Administered 2018-07-01: 250 mg via INTRAMUSCULAR
  Filled 2018-07-01: qty 250

## 2018-07-01 MED ORDER — AZITHROMYCIN 250 MG PO TABS
1000.0000 mg | ORAL_TABLET | Freq: Once | ORAL | Status: AC
  Administered 2018-07-01: 1000 mg via ORAL
  Filled 2018-07-01: qty 4

## 2018-07-01 NOTE — ED Provider Notes (Signed)
[facility] EMERGENCY DEPARTMENT Provider Note   CSN: [id number] Arrival date & time: 07/01/18  1504     History   Chief Complaint Chief Complaint  Patient presents with  . Vaginal Discharge    HPI Jacqueline Morrow is a 32 y.o. female.  HPI  Jacqueline Morrow is a 32yo female with a history of bipolar disorder, migraines, allergic rhinitis and GERD who presents to the emergency department for evaluation of vaginal irritation and discharge.  Patient reports that she is sexually active with one female partner, denies regular condom use.  States that 3 days ago she noticed some mild persistent suprapubic cramping.  States that she noticed copious amounts of green discharge yesterday.  Has also had vaginal itching, used Vagisil with some mild improvement in the itching.  She states that she has very irregular periods, last was about a year ago.  She has had a prior surgery for ovarian tumor removal.  She reports that she had a normal bowel movement earlier today.  She denies fevers, chills, nausea/vomiting, vaginal bleeding, dysuria, urinary frequency, hematuria, lightheadedness or syncope.  Past Medical History:  Diagnosis Date  . Allergic rhinitis   . Arthritis   . Bipolar 1 disorder ([place])   . Depression   . GERD (gastroesophageal reflux disease)   . Migraine   . Obese   . Vaginal delivery 2006    Patient Active Problem List   Diagnosis Date Noted  . Bipolar 1 disorder ([place])   . 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 Cholelithiasis without obstruction 09/15/2011  . Nausea 09/08/2011  . Smoker 09/08/2011  . Overweight(278.02)   . Back pain, chronic     Past Surgical History:  Procedure Laterality Date  . ANTERIOR CRUCIATE LIGAMENT REPAIR  2009  . CERVICAL CONIZATION W/BX  01/31/2012   Procedure: CONIZATION CERVIX WITH BIOPSY;  Surgeon: Jonnie Kind, MD;  Location: [facility];  Service: Gynecology;  Laterality: N/A;  Cold Knife Conization of  Cervix     OB History   None      Home Medications    Prior to Admission medications   Not on File    Family History Family History  Problem Relation Age of Onset  . Arthritis Other        both parents & grandparents  . Heart disease Other        grandparents  . Hyperlipidemia Other        grandparents  . Diabetes Paternal Aunt   . Hypertension Maternal Grandmother   . Anesthesia problems Neg Hx   . Hypotension Neg Hx   . Malignant hyperthermia Neg Hx   . Pseudochol deficiency Neg Hx     Social History Social History   Tobacco Use  . Smoking status: Former Smoker    Packs/day: 1.00    Years: 8.00    Pack years: 8.00    Types: Cigarettes    Last attempt to quit: 02/01/2018    Years since quitting: 0.4  . Smokeless tobacco: Never Used  Substance Use Topics  . Alcohol use: Yes    Comment: occasional  . Drug use: No     Allergies   Raspberry   Review of Systems Review of Systems  Constitutional: Negative for chills and fever.  HENT: Negative for mouth sores.   Respiratory: Negative for shortness of breath.   Cardiovascular: Negative for chest pain.  Gastrointestinal: Positive for abdominal pain (lower abdominal cramping). Negative for nausea and  vomiting.  Genitourinary: Positive for vaginal discharge. Negative for difficulty urinating, dyspareunia, dysuria, flank pain, frequency, hematuria, menstrual problem, vaginal bleeding and vaginal pain.  Musculoskeletal: Negative for back pain and gait problem.  Skin: Negative for rash.  Neurological: Negative for syncope and light-headedness.  Psychiatric/Behavioral: Negative for agitation.     Physical Exam Updated Vital Signs BP 117/65 (BP Location: Right Arm)   Pulse 82   Temp 99.6 F (37.6 C) (Oral)   Resp 20   Ht 5\' 10"  (1.778 m)   Wt 136.1 kg (300 lb)   SpO2 99%   BMI 43.05 kg/m   Physical Exam  Constitutional: She is oriented to person, place, and time. She appears well-developed and  well-nourished. No distress.  Nontoxic-appearing.  HENT:  Head: Normocephalic and atraumatic.  Mouth/Throat: Oropharynx is clear and moist.  Eyes: Pupils are equal, round, and reactive to light. Conjunctivae are normal. Right eye exhibits no discharge. Left eye exhibits no discharge.  Neck: Normal range of motion.  Cardiovascular: Normal rate and regular rhythm.  Pulmonary/Chest: Effort normal and breath sounds normal. No stridor. No respiratory distress. She has no wheezes. She has no rales.  Abdominal:  Abdomen soft and nondistended.  Well-healed surgical scar superior to the umbilicus.  Bowel sounds normoactive in all 4 quadrants.  Abdomen nontender to palpation.  No CVA tenderness.  Genitourinary:  Genitourinary Comments: Chaperone present for exam. Copious green frothy discharge. No CMT. No adnexal masses, tenderness, or fullness.  No bleeding within vaginal vault.  Musculoskeletal: Normal range of motion.  Neurological: She is alert and oriented to person, place, and time. Coordination normal.  Skin: Skin is warm and dry. She is not diaphoretic.  Psychiatric: She has a normal mood and affect. Her behavior is normal.  Nursing note and vitals reviewed.    ED Treatments / Results  Labs (all labs ordered are listed, but only abnormal results are displayed) Labs Reviewed  WET PREP, GENITAL - Abnormal; Notable for the following components:      Result Value   Trich, Wet Prep PRESENT (*)    Clue Cells Wet Prep HPF POC PRESENT (*)    WBC, Wet Prep HPF POC MANY (*)    All other components within normal limits  URINALYSIS, ROUTINE W REFLEX MICROSCOPIC - Abnormal; Notable for the following components:   APPearance HAZY (*)    Hgb urine dipstick SMALL (*)    Nitrite POSITIVE (*)    Leukocytes, UA LARGE (*)    Bacteria, UA MANY (*)    All other components within normal limits  RAPID HIV SCREEN (HIV 1/2 AB+AG)  RPR  POC URINE PREG, ED  GC/CHLAMYDIA PROBE AMP ([place]) NOT AT [facility]     EKG None  Radiology No results found.  Procedures Procedures (including critical care time)  Medications Ordered in ED Medications  ondansetron (ZOFRAN-ODT) disintegrating tablet 4 mg (4 mg Oral Given 07/01/18 2039)  metroNIDAZOLE (FLAGYL) tablet 2,000 mg (2,000 mg Oral Given 07/01/18 2038)  cefTRIAXone (ROCEPHIN) injection 250 mg (250 mg Intramuscular Given 07/01/18 2039)  azithromycin (ZITHROMAX) tablet 1,000 mg (1,000 mg Oral Given 07/01/18 2038)  sterile water (preservative free) injection 0.9 mL (0.9 mLs Intramuscular Given 07/01/18 2039)     Initial Impression / Assessment and Plan / ED Course  I have reviewed the triage vital signs and the nursing notes.  Pertinent labs & imaging results that were available during my care of the patient were reviewed by me and considered in my medical decision  making (see chart for details).      Patient presents with vaginal discharge and mild lower abdominal cramping.  Wet prep reveals trichomonas as well as clue cells.  GC/chlamydia, syphilis and HIV testing is pending.  UA nitrite positive and large leukocytes, consistent with cystitis.  On exam she is afebrile without CVA tenderness and I do not suspect acute pyelonephritis.  No CMT or adnexal tenderness to suggest PID.  Patient was treated in the emergency department with Flagyl for trichomonas.  She was also treated prophylactically for GC/chlamydia with Rocephin and azithromycin.  She will be discharged home with Keflex for UTI and 7-day course of Flagyl for BV.  I have counseled her to avoid alcohol while taking metronidazole.  I have also counseled her to inform all sexual partners that they will need to go to the health department and be treated for trichomonas.  She has been given information to follow-up with the health department for future STD concerns.  Have counseled her on reasons to return to the emergency department and she agrees and appears reliable.  Final Clinical  Impressions(s) / ED Diagnoses   Final diagnoses:  Trichimoniasis    ED Discharge Orders        Ordered    metroNIDAZOLE (FLAGYL) 500 MG tablet  2 times daily     07/01/18 2034    cephALEXin (KEFLEX) 500 MG capsule  2 times daily     07/01/18 2034       Glyn Ade, PA-C 07/01/18 2244    Charlesetta Shanks, MD 07/01/18 [phone]

## 2018-07-01 NOTE — Discharge Instructions (Signed)
You have trichomonas which is an STD.  You also have a urinary tract infection.  You have chlamydia, gonorrhea and syphilis testing which is pending.  These results will return in 2 to 3 days and he will receive a phone call if these tests are positive.  You were treated for chlamydia, gonorrhea, trichomonas in the ER today.  You will be discharged home with medicine to treat urinary tract infection and BV.  Please take antibiotics as prescribed for the next week.  Do not have sex for the next week while you are being treated.  Please also refrain from drinking alcohol, as these medicines can have a poor reaction.  I have listed the information to the health department below.  Please let all sexual partners know that they will need to be treated and tested for STDs.  Return to the ER if you have any new or concerning symptoms like fever, worsening abdominal pain, vomiting that will not stop.

## 2018-07-01 NOTE — ED Triage Notes (Signed)
Pt reports she has been itchy and sore in her vagina with accompanied abd cramps for the last 2-3 days. Pt reports green/yellow discharge that started last night. Pt denies urinary sx and vaginal bleeding.

## 2018-07-02 LAB — RPR: RPR Ser Ql: NONREACTIVE

## 2018-07-02 LAB — GC/CHLAMYDIA PROBE AMP (~~LOC~~) NOT AT ARMC
CHLAMYDIA, DNA PROBE: NEGATIVE
NEISSERIA GONORRHEA: NEGATIVE

## 2018-07-05 ENCOUNTER — Encounter (HOSPITAL_COMMUNITY): Payer: Self-pay | Admitting: Emergency Medicine

## 2018-07-05 ENCOUNTER — Emergency Department (HOSPITAL_COMMUNITY)
Admission: EM | Admit: 2018-07-05 | Discharge: 2018-07-06 | Disposition: A | Discharge status: Home / Self Care | Payer: Self-pay | Attending: Emergency Medicine | Admitting: Emergency Medicine

## 2018-07-05 ENCOUNTER — Emergency Department (HOSPITAL_COMMUNITY): Payer: Self-pay

## 2018-07-05 DIAGNOSIS — R10814 Left lower quadrant abdominal tenderness: Secondary | ICD-10-CM | POA: Insufficient documentation

## 2018-07-05 DIAGNOSIS — R10813 Right lower quadrant abdominal tenderness: Secondary | ICD-10-CM | POA: Insufficient documentation

## 2018-07-05 DIAGNOSIS — F319 Bipolar disorder, unspecified: Secondary | ICD-10-CM | POA: Insufficient documentation

## 2018-07-05 DIAGNOSIS — R1900 Intra-abdominal and pelvic swelling, mass and lump, unspecified site: Secondary | ICD-10-CM | POA: Insufficient documentation

## 2018-07-05 DIAGNOSIS — R10815 Periumbilic abdominal tenderness: Secondary | ICD-10-CM | POA: Insufficient documentation

## 2018-07-05 DIAGNOSIS — Z87891 Personal history of nicotine dependence: Secondary | ICD-10-CM | POA: Insufficient documentation

## 2018-07-05 DIAGNOSIS — Z8543 Personal history of malignant neoplasm of ovary: Secondary | ICD-10-CM | POA: Insufficient documentation

## 2018-07-05 LAB — CBC WITH DIFFERENTIAL/PLATELET
BASOS PCT: 0 %
Basophils Absolute: 0 10*3/uL (ref 0.0–0.1)
Eosinophils Absolute: 0.1 10*3/uL (ref 0.0–0.7)
Eosinophils Relative: 1 %
HCT: 40.7 % (ref 36.0–46.0)
HEMOGLOBIN: 13.4 g/dL (ref 12.0–15.0)
Lymphocytes Relative: 33 %
Lymphs Abs: 3.1 10*3/uL (ref 0.7–4.0)
MCH: 29.6 pg (ref 26.0–34.0)
MCHC: 32.9 g/dL (ref 30.0–36.0)
MCV: 90 fL (ref 78.0–100.0)
MONO ABS: 0.7 10*3/uL (ref 0.1–1.0)
MONOS PCT: 7 %
NEUTROS PCT: 59 %
Neutro Abs: 5.7 10*3/uL (ref 1.7–7.7)
PLATELETS: 272 10*3/uL (ref 150–400)
RBC: 4.52 MIL/uL (ref 3.87–5.11)
RDW: 12.6 % (ref 11.5–15.5)
WBC: 9.6 10*3/uL (ref 4.0–10.5)

## 2018-07-05 LAB — POC URINE PREG, ED: Preg Test, Ur: NEGATIVE

## 2018-07-05 LAB — URINALYSIS, ROUTINE W REFLEX MICROSCOPIC
BILIRUBIN URINE: NEGATIVE
Bacteria, UA: NONE SEEN
Glucose, UA: NEGATIVE mg/dL
HGB URINE DIPSTICK: NEGATIVE
KETONES UR: NEGATIVE mg/dL
NITRITE: NEGATIVE
PH: 6 (ref 5.0–8.0)
Protein, ur: NEGATIVE mg/dL
Specific Gravity, Urine: 1.018 (ref 1.005–1.030)

## 2018-07-05 LAB — BASIC METABOLIC PANEL
Anion gap: 8 (ref 5–15)
BUN: 10 mg/dL (ref 6–20)
CALCIUM: 9.2 mg/dL (ref 8.9–10.3)
CO2: 27 mmol/L (ref 22–32)
CREATININE: 0.96 mg/dL (ref 0.44–1.00)
Chloride: 110 mmol/L (ref 98–111)
Glucose, Bld: 97 mg/dL (ref 70–99)
Potassium: 3.8 mmol/L (ref 3.5–5.1)
Sodium: 145 mmol/L (ref 135–145)

## 2018-07-05 MED ORDER — IOPAMIDOL (ISOVUE-300) INJECTION 61%
100.0000 mL | Freq: Once | INTRAVENOUS | Status: AC | PRN
  Administered 2018-07-05: 100 mL via INTRAVENOUS

## 2018-07-05 MED ORDER — IOPAMIDOL (ISOVUE-300) INJECTION 61%
INTRAVENOUS | Status: AC
  Filled 2018-07-05: qty 100

## 2018-07-05 MED ORDER — IOHEXOL 300 MG/ML  SOLN
30.0000 mL | Freq: Once | INTRAMUSCULAR | Status: AC | PRN
  Administered 2018-07-05: 30 mL via ORAL

## 2018-07-05 NOTE — ED Provider Notes (Signed)
Jacqueline Morrow Provider Note   CSN: [id number] Arrival date & time: 07/05/18  1405     History   Chief Complaint Chief Complaint  Patient presents with  . Abdominal Pain    HPI Jacqueline Morrow is a 32 y.o. female.  HPI Jacqueline Morrow is a 32 y.o. female with history of bipolar disorder, migraine headaches, obesity, history of ovarian tumor, presents to emergency department with complaint of pelvic pain.  Patient states that she has had pain in her lower abdomen for the last week.  She was seen here on 07/01/2018 for the same.  Her work-up showed that she was positive for trichomonas, and her urine analysis showed urinary tract infection.  The rest of the STI work-up was negative.  Patient was treated in emergency department with Flagyl 2 g, Rocephin 250 mg IM, Zithromax 1 g p.o.  She was given prescription for Flagyl at home and Keflex for her UTI.  She states she is still taking both antibiotics.  She states that in the last several days her pain has gotten worse instead of better.  She denies any urinary symptoms.  She states her vaginal discharge that she has had got much better.  She states her pain is mainly in the midline, but sometimes shoots into either 1 of the sides.  She denies any changes in her bowels.  No nausea or vomiting.  No fever.  No flank pain.  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  She states her symptoms feel similar.  She states she lost insurance for years ago has not been following up for recheck.  Past Medical History:  Diagnosis Date  . Allergic rhinitis   . Arthritis   . Bipolar 1 disorder ([place])   . Depression   . GERD (gastroesophageal reflux disease)   . Migraine   . Obese   . Vaginal delivery 2006    Patient Active Problem List   Diagnosis Date  Noted  . Bipolar 1 disorder ([place])   . 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 Cholelithiasis without obstruction 09/15/2011  . Nausea 09/08/2011  . Smoker 09/08/2011  . Overweight(278.02)   . Back pain, chronic     Past Surgical History:  Procedure Laterality Date  . ANTERIOR CRUCIATE LIGAMENT REPAIR  2009  . CERVICAL CONIZATION W/BX  01/31/2012   Procedure: CONIZATION CERVIX WITH BIOPSY;  Surgeon: Jonnie Kind, MD;  Location: [facility];  Service: Gynecology;  Laterality: N/A;  Cold Knife Conization of Cervix     OB History   None      Home Medications    Prior to Admission medications   Medication Sig Start Date End Date Taking? Authorizing Provider  cephALEXin (KEFLEX) 500 MG capsule Take 1 capsule (500 mg total) by mouth 2 (two) times daily for 7 days. 07/01/18 07/08/18 Yes Shrosbree, Mirian Mo, PA-C  diphenhydramine-acetaminophen (TYLENOL PM) 25-500 MG TABS tablet Take 2 tablets by mouth at bedtime as needed (sleep/pain).   Yes [provider]  metroNIDAZOLE (FLAGYL) 500 MG tablet Take 1 tablet (500 mg total) by mouth 2 (two) times daily. 07/01/18  Yes Glyn Ade, PA-C    Family History Family History  Problem Relation Age of Onset  . Arthritis Other  both parents & grandparents  . Heart disease Other        grandparents  . Hyperlipidemia Other        grandparents  . Diabetes Paternal Aunt   . Hypertension Maternal Grandmother   . Anesthesia problems Neg Hx   . Hypotension Neg Hx   . Malignant hyperthermia Neg Hx   . Pseudochol deficiency Neg Hx     Social History Social History   Tobacco Use  . Smoking status: Former Smoker    Packs/day: 1.00    Years: 8.00    Pack years: 8.00    Types: Cigarettes    Last attempt to quit: 02/01/2018    Years since quitting: 0.4  . Smokeless tobacco: Never Used  Substance Use Topics  . Alcohol use: Yes    Comment: occasional  . Drug use: No     Allergies   Raspberry   Review of  Systems Review of Systems  Constitutional: Negative for chills and fever.  Respiratory: Negative for cough, chest tightness and shortness of breath.   Cardiovascular: Negative for chest pain, palpitations and leg swelling.  Gastrointestinal: Positive for abdominal pain. Negative for diarrhea, nausea and vomiting.  Genitourinary: Positive for pelvic pain. Negative for dysuria, flank pain, vaginal bleeding, vaginal discharge and vaginal pain.  Musculoskeletal: Negative for arthralgias, myalgias, neck pain and neck stiffness.  Skin: Negative for rash.  Neurological: Negative for dizziness, weakness and headaches.  All other systems reviewed and are negative.    Physical Exam Updated Vital Signs BP (!) 144/92 (BP Location: Left Arm)   Pulse 87   Temp 98.6 F (37 C) (Oral)   Resp 18   SpO2 99%   Physical Exam  Constitutional: She appears well-developed and well-nourished. No distress.  HENT:  Head: Normocephalic.  Eyes: Conjunctivae are normal.  Neck: Neck supple.  Cardiovascular: Normal rate, regular rhythm and normal heart sounds.  Pulmonary/Chest: Effort normal and breath sounds normal. No respiratory distress. She has no wheezes. She has no rales.  Abdominal: Soft. Bowel sounds are normal. She exhibits no distension. There is tenderness in the right lower quadrant, suprapubic area and left lower quadrant. There is no rebound.  Musculoskeletal: She exhibits no edema.  Neurological: She is alert.  Skin: Skin is warm and dry.  Psychiatric: She has a normal mood and affect. Her behavior is normal.  Nursing note and vitals reviewed.    ED Treatments / Results  Labs (all labs ordered are listed, but only abnormal results are displayed) Labs Reviewed  URINALYSIS, ROUTINE W REFLEX MICROSCOPIC - Abnormal; Notable for the following components:      Result Value   Leukocytes, UA SMALL (*)    All other components within normal limits  CBC WITH DIFFERENTIAL/PLATELET  BASIC  METABOLIC PANEL  POC URINE PREG, ED    EKG None  Radiology Ct Abdomen Pelvis W Contrast  Result Date: 07/05/2018 CLINICAL DATA:  Worsening abdominal pain since Sunday. Treated at that time for vaginal infection and urinary tract infection. Still on antibiotics. EXAM: CT ABDOMEN AND PELVIS WITH CONTRAST TECHNIQUE: Multidetector CT imaging of the abdomen and pelvis was performed using the standard protocol following bolus administration of intravenous contrast. CONTRAST:  7mL OMNIPAQUE IOHEXOL 300 MG/ML SOLN, 135mL ISOVUE-300 IOPAMIDOL (ISOVUE-300) INJECTION 61% COMPARISON:  03/12/2015 FINDINGS: Lower chest: Mild dependent changes in the lung bases. Hepatobiliary: Cholelithiasis with multiple stones in the gallbladder. No gallbladder wall thickening or infiltration. No bile duct dilatation. No focal liver lesions. Pancreas: Unremarkable. No  pancreatic ductal dilatation or surrounding inflammatory changes. Spleen: Normal in size without focal abnormality. Adrenals/Urinary Tract: Adrenal glands are unremarkable. Kidneys are normal, without renal calculi, focal lesion, or hydronephrosis. There is a hyperattenuating filling defect in the right anterior bladder in a nondependent position. This could represent a polypoid lesion or possibly adherent clot. No bladder wall thickening. Consider follow-up or cystoscopy for further evaluation. Stomach/Bowel: Stomach, small bowel, and colon are mostly decompressed. Scattered stool in the colon. No bowel wall thickening or infiltration is identified. Appendix is normal. Vascular/Lymphatic: No significant vascular findings are present. No enlarged abdominal or pelvic lymph nodes. Reproductive: Uterus is normal in size. The large pelvic mass seen on the previous study has either been resected or resolved since previous study. In the right adnexa, there is a ovoid solid-appearing structure measuring 3.7 x 5.3 cm diameter. This may represent an enlarged ovary or hemorrhagic  cyst. Two additional cystic structures are demonstrated in the right adnexal region measuring 3.9 cm diameter and 3.3 cm diameter. Cystic structure in the left adnexal region measuring 4.2 cm diameter. Pelvic ultrasound is recommended for further evaluation. Changes could represent complex paraovarian cysts, dilated tubules, or inflammatory process. Other: No free air or free fluid in the abdomen. Abdominal wall musculature appears intact. Musculoskeletal: Degenerative changes in the spine. No destructive bone lesions. IMPRESSION: 1. Multiple cystic and solid pelvic lesions, largest in the right adnexa measuring 5.3 cm maximal diameter. Differential diagnosis would include ovarian torsion, cystic neoplasm, hemorrhagic cysts, hydrosalpinx, or pelvic inflammatory process. Pelvic ultrasound is recommended for further evaluation. 2. Hyperattenuating filling defect in the nondependent bladder could represent a polypoid lesion or adherent blood clot. Correlation with urinalysis recommended. Suggest follow-up or cystoscopy to exclude neoplastic change. 3. Cholelithiasis without evidence of cholecystitis. Electronically Signed   By: Lucienne Capers M.D.   On: 07/05/2018 21:18   US Pelvic Doppler (torsion R/o Or Mass Arterial Flow)  Result Date: 07/05/2018 CLINICAL DATA:  Initial evaluation for pelvic mass. EXAM: TRANSABDOMINAL AND TRANSVAGINAL ULTRASOUND OF PELVIS DOPPLER ULTRASOUND OF OVARIES TECHNIQUE: Both transabdominal and transvaginal ultrasound examinations of the pelvis were performed. Transabdominal technique was performed for global imaging of the pelvis including uterus, ovaries, adnexal regions, and pelvic cul-de-sac. It was necessary to proceed with endovaginal exam following the transabdominal exam to visualize the uterus, endometrium, and ovaries. Color and duplex Doppler ultrasound was utilized to evaluate blood flow to the ovaries. COMPARISON:  Prior CT from earlier the same day. FINDINGS: Uterus  Measurements: 8.4 x 4.0 x 5.5 cm. No fibroids or other mass visualized. Endometrium Thickness: 5.2 mm.  No focal abnormality visualized. Right ovary Measurements: 4.0 x 2.7 x 4.2 cm. Complex predominantly solid lesion measuring 4.1 x 3.6 x 3.8 cm present within the adjacent right adnexa. There is an additional complex cystic mass measuring 4.5 x 3.5 x 3.7 cm. Multiple internal irregular and somewhat thick septations with scattered internal vascularity. Left ovary Native left ovary not seen. Predominant simple cyst measuring 4.9 x 3.2 x 3.9 cm. No significant internal complexity or vascularity. Pulsed Doppler evaluation of the right ovary demonstrates normal low-resistance arterial and venous waveforms. Other findings Trace free fluid within the pelvis. IMPRESSION: 1. Complex solid and cystic right adnexal lesions measuring up to 4.5 cm, with additional cystic left adnexal lesion measuring up to 4.9 cm. Findings are indeterminate, but most concerning for possible ovarian neoplasm. Gynecologic referral for surgical consultation recommended. Additionally, further evaluation with dedicated pelvic MRI, with and without contrast may be helpful for  further characterization. 2. Normal vascular flow within the right ovary, with no evidence for torsion. Native left ovary not visualized on this exam. 3. Normal sonographic appearance of the uterus and endometrium. Electronically Signed   By: Jeannine Boga M.D.   On: 07/05/2018 23:15   US Pelvic Complete With Transvaginal  Result Date: 07/05/2018 CLINICAL DATA:  Initial evaluation for pelvic mass. EXAM: TRANSABDOMINAL AND TRANSVAGINAL ULTRASOUND OF PELVIS DOPPLER ULTRASOUND OF OVARIES TECHNIQUE: Both transabdominal and transvaginal ultrasound examinations of the pelvis were performed. Transabdominal technique was performed for global imaging of the pelvis including uterus, ovaries, adnexal regions, and pelvic cul-de-sac. It was necessary to proceed with endovaginal  exam following the transabdominal exam to visualize the uterus, endometrium, and ovaries. Color and duplex Doppler ultrasound was utilized to evaluate blood flow to the ovaries. COMPARISON:  Prior CT from earlier the same day. FINDINGS: Uterus Measurements: 8.4 x 4.0 x 5.5 cm. No fibroids or other mass visualized. Endometrium Thickness: 5.2 mm.  No focal abnormality visualized. Right ovary Measurements: 4.0 x 2.7 x 4.2 cm. Complex predominantly solid lesion measuring 4.1 x 3.6 x 3.8 cm present within the adjacent right adnexa. There is an additional complex cystic mass measuring 4.5 x 3.5 x 3.7 cm. Multiple internal irregular and somewhat thick septations with scattered internal vascularity. Left ovary Native left ovary not seen. Predominant simple cyst measuring 4.9 x 3.2 x 3.9 cm. No significant internal complexity or vascularity. Pulsed Doppler evaluation of the right ovary demonstrates normal low-resistance arterial and venous waveforms. Other findings Trace free fluid within the pelvis. IMPRESSION: 1. Complex solid and cystic right adnexal lesions measuring up to 4.5 cm, with additional cystic left adnexal lesion measuring up to 4.9 cm. Findings are indeterminate, but most concerning for possible ovarian neoplasm. Gynecologic referral for surgical consultation recommended. Additionally, further evaluation with dedicated pelvic MRI, with and without contrast may be helpful for further characterization. 2. Normal vascular flow within the right ovary, with no evidence for torsion. Native left ovary not visualized on this exam. 3. Normal sonographic appearance of the uterus and endometrium. Electronically Signed   By: Jeannine Boga M.D.   On: 07/05/2018 23:15    Procedures Procedures (including critical care time)  Medications Ordered in ED Medications - No data to display   Initial Impression / Assessment and Plan / ED Course  I have reviewed the triage vital signs and the nursing  notes.  Pertinent labs & imaging results that were available during my care of the patient were reviewed by me and considered in my medical decision making (see chart for details).    Patient with worsening lower abdominal pain.  Diagnosed with trichomonas and currently on Flagyl for bacterial vaginosis.  Gonorrhea and Chlamydia cultures came back negative.  Will report urine analysis to make sure her UTI is improving.  There were no culture sent of her urine during prior visit.  She is on Keflex.  Will get basic labs.  5:28 PM Patient's urine is clearing up with antibiotics.  She is not pregnant.  Labs are normal.  Patient again is concerned about possible recurrent pelvic mass.  She continues to have pelvic pain.  She is currently taking Flagyl and Keflex.  I will get CT abdomen pelvis for further evaluation.   She is CT scan showed concerning cystic and solid masses in the pelvis.  Ultrasound was recommended.  Will get ultrasound.  11:36 PM Patient's ultrasound confirms complex solid and cystic right adnexal lesions as well as  left adnexal lesion.  Although findings are indeterminant, concerning for ovarian neoplasm.  Patient states that she in the past has followed with GYN oncology in [place].  She does not remember the physician's name but knows the location and will be able to give him a call tomorrow.  I explained to her that it is very important that she follows up as soon as possible.  She will call the office tomorrow and see if she can get in for an appointment.  We also discussed that I will give her the [facility] number in case she needs to follow-up there and unable to see the specialist in [place].  Although since she has already been treated for an ovarian cancer in the past by this other physician with prior surgery by them, I strongly encouraged her to follow-up with them.  Patient understood the importance of close follow-up and will call them tomorrow.   We discussed strict return precautions.  Vitals:   07/05/18 1414 07/05/18 1745 07/05/18 1948 07/06/18 0021  BP: (!) 144/92 (!) 148/86 (!) 103/58 128/81  Pulse: 87 80 63 60  Resp: 18 16 16 17   Temp: 98.6 F (37 C)  98 F (36.7 C) 98.7 F (37.1 C)  TempSrc: Oral  Oral Oral  SpO2: 99% 100% 98% 100%     Final Clinical Impressions(s) / ED Diagnoses   Final diagnoses:  Pelvic mass  Pelvic mass    ED Discharge Orders    None       Jeannett Senior, PA-C 07/06/18 0031    Daleen Bo, MD 07/07/18 [phone]

## 2018-07-05 NOTE — ED Triage Notes (Signed)
Pt c/o abd pains that has gotten worse since seen on Sunday. Pt reports that she was treated for Trich and another vaginal infection as well UTI. Reports is still on antibiotics. Pt reports pain got so bad today that she had to leave work.

## 2018-07-05 NOTE — Discharge Instructions (Signed)
Please follow-up with your primary physician as soon as possible for further work-up of your pelvic masses.  You can also call the cancer center here [facility] if unable to follow-up with your provider to see if he can be seen there.  Return if any worsening symptoms.

## 2018-07-14 ENCOUNTER — Emergency Department (HOSPITAL_COMMUNITY): Payer: Self-pay

## 2018-07-14 ENCOUNTER — Emergency Department (HOSPITAL_COMMUNITY)
Admission: EM | Admit: 2018-07-14 | Discharge: 2018-07-14 | Disposition: A | Discharge status: Home / Self Care | Payer: Self-pay | Attending: Emergency Medicine | Admitting: Emergency Medicine

## 2018-07-14 ENCOUNTER — Encounter (HOSPITAL_COMMUNITY): Payer: Self-pay

## 2018-07-14 ENCOUNTER — Other Ambulatory Visit: Payer: Self-pay

## 2018-07-14 DIAGNOSIS — R102 Pelvic and perineal pain: Secondary | ICD-10-CM | POA: Insufficient documentation

## 2018-07-14 DIAGNOSIS — Z87891 Personal history of nicotine dependence: Secondary | ICD-10-CM | POA: Insufficient documentation

## 2018-07-14 DIAGNOSIS — R1030 Lower abdominal pain, unspecified: Secondary | ICD-10-CM

## 2018-07-14 DIAGNOSIS — Z79899 Other long term (current) drug therapy: Secondary | ICD-10-CM | POA: Insufficient documentation

## 2018-07-14 LAB — CBC WITH DIFFERENTIAL/PLATELET
BASOS ABS: 0 10*3/uL (ref 0.0–0.1)
BASOS PCT: 0 %
EOS ABS: 0.1 10*3/uL (ref 0.0–0.7)
Eosinophils Relative: 1 %
HEMATOCRIT: 40.1 % (ref 36.0–46.0)
Hemoglobin: 13.3 g/dL (ref 12.0–15.0)
Lymphocytes Relative: 35 %
Lymphs Abs: 3.3 10*3/uL (ref 0.7–4.0)
MCH: 30 pg (ref 26.0–34.0)
MCHC: 33.2 g/dL (ref 30.0–36.0)
MCV: 90.3 fL (ref 78.0–100.0)
Monocytes Absolute: 0.8 10*3/uL (ref 0.1–1.0)
Monocytes Relative: 8 %
NEUTROS ABS: 5.2 10*3/uL (ref 1.7–7.7)
Neutrophils Relative %: 56 %
PLATELETS: 255 10*3/uL (ref 150–400)
RBC: 4.44 MIL/uL (ref 3.87–5.11)
RDW: 12.6 % (ref 11.5–15.5)
WBC: 9.5 10*3/uL (ref 4.0–10.5)

## 2018-07-14 LAB — COMPREHENSIVE METABOLIC PANEL
ALBUMIN: 4 g/dL (ref 3.5–5.0)
ALT: 45 U/L — ABNORMAL HIGH (ref 0–44)
ANION GAP: 10 (ref 5–15)
AST: 29 U/L (ref 15–41)
Alkaline Phosphatase: 62 U/L (ref 38–126)
BILIRUBIN TOTAL: 0.7 mg/dL (ref 0.3–1.2)
BUN: 9 mg/dL (ref 6–20)
CHLORIDE: 105 mmol/L (ref 98–111)
CO2: 25 mmol/L (ref 22–32)
Calcium: 8.9 mg/dL (ref 8.9–10.3)
Creatinine, Ser: 0.85 mg/dL (ref 0.44–1.00)
GFR calc Af Amer: >60 mL/min (ref >60)
GFR calc non Af Amer: >60 mL/min (ref >60)
GLUCOSE: 86 mg/dL (ref 70–99)
POTASSIUM: 3.6 mmol/L (ref 3.5–5.1)
SODIUM: 140 mmol/L (ref 135–145)
Total Protein: 7.5 g/dL (ref 6.5–8.1)

## 2018-07-14 LAB — LIPASE, BLOOD: Lipase: 33 U/L (ref 11–51)

## 2018-07-14 MED ORDER — ONDANSETRON HCL 4 MG/2ML IJ SOLN
4.0000 mg | Freq: Once | INTRAMUSCULAR | Status: AC
  Administered 2018-07-14: 4 mg via INTRAVENOUS
  Filled 2018-07-14: qty 2

## 2018-07-14 MED ORDER — ONDANSETRON 4 MG PO TBDP: 4.0000 mg | ORAL_TABLET | Freq: Three times a day (TID) | ORAL | 0 refills | Status: DC | PRN

## 2018-07-14 MED ORDER — OXYCODONE-ACETAMINOPHEN 5-325 MG PO TABS
2.0000 | ORAL_TABLET | Freq: Once | ORAL | Status: DC
  Filled 2018-07-14: qty 2

## 2018-07-14 MED ORDER — OXYCODONE-ACETAMINOPHEN 5-325 MG PO TABS
1.0000 | ORAL_TABLET | Freq: Four times a day (QID) | ORAL | 0 refills | Status: DC | PRN
Start: 2018-07-14 — End: 2022-02-09

## 2018-07-14 MED ORDER — DOCUSATE SODIUM 250 MG PO CAPS: 250.0000 mg | ORAL_CAPSULE | Freq: Every day | ORAL | 0 refills | Status: DC

## 2018-07-14 MED ORDER — KETOROLAC TROMETHAMINE 15 MG/ML IJ SOLN
15.0000 mg | Freq: Once | INTRAMUSCULAR | Status: AC
Start: 2018-07-14 — End: 2018-07-14
  Administered 2018-07-14: 15 mg via INTRAVENOUS
  Filled 2018-07-14: qty 1

## 2018-07-14 MED ORDER — HYDROMORPHONE HCL 1 MG/ML IJ SOLN
1.0000 mg | Freq: Once | INTRAMUSCULAR | Status: AC
  Administered 2018-07-14: 1 mg via INTRAVENOUS
  Filled 2018-07-14: qty 1

## 2018-07-14 MED ORDER — SODIUM CHLORIDE 0.9 % IV BOLUS
1000.0000 mL | Freq: Once | INTRAVENOUS | Status: AC
  Administered 2018-07-14: 1000 mL via INTRAVENOUS

## 2018-07-14 NOTE — ED Provider Notes (Signed)
[facility] DEPT Provider Note   CSN: [id number] Arrival date & time: 07/14/18  1326     History   Chief Complaint Chief Complaint  Patient presents with  . Abdominal Pain    HPI Jacqueline Morrow is a 32 y.o. female.  HPI  32 year old female with past medical history as below here with ongoing lower abdominal pain.  The patient states that over the last 2 to 3 weeks, she has had progressive worsening aching, throbbing, lower midline and right lower abdominal pain.  She was seen on 7/25 for the symptoms and diagnosed with multiple other cysts concerning for neoplasm.  She saw her OB/GYN the next day, and is currently being worked up for possible recurrence of cancer.  Patient has been taking Tylenol and Advil without significant relief.  She reports aching, throbbing, severe, lower abdominal pain.  The pain seems to come in go intermittently, but is now constant.  Denies any nausea or vomiting.  No fevers or chills.  No urinary symptoms.  No vaginal bleeding or discharge.  No alleviating factors.   Past Medical History:  Diagnosis Date  . Allergic rhinitis   . Arthritis   . Bipolar 1 disorder ([place])   . Depression   . GERD (gastroesophageal reflux disease)   . Migraine   . Obese   . Vaginal delivery 2006    Patient Active Problem List   Diagnosis Date Noted  . Bipolar 1 disorder ([place])   . 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 Cholelithiasis without obstruction 09/15/2011  . Nausea 09/08/2011  . Smoker 09/08/2011  . Overweight(278.02)   . Back pain, chronic     Past Surgical History:  Procedure Laterality Date  . ANTERIOR CRUCIATE LIGAMENT REPAIR  2009  . CERVICAL CONIZATION W/BX  01/31/2012   Procedure: CONIZATION CERVIX WITH BIOPSY;  Surgeon: Jonnie Kind, MD;  Location: [facility];  Service: Gynecology;  Laterality: N/A;  Cold Knife Conization of Cervix     OB History   None      Home Medications    Prior to  Admission medications   Medication Sig Start Date End Date Taking? Authorizing Provider  diphenhydramine-acetaminophen (TYLENOL PM) 25-500 MG TABS tablet Take 2 tablets by mouth at bedtime as needed (sleep/pain).    [provider]  docusate sodium (COLACE) 250 MG capsule Take 1 capsule (250 mg total) by mouth daily. 07/14/18   Duffy Bruce, MD  metroNIDAZOLE (FLAGYL) 500 MG tablet Take 1 tablet (500 mg total) by mouth 2 (two) times daily. 07/01/18   Glyn Ade, PA-C  ondansetron (ZOFRAN ODT) 4 MG disintegrating tablet Take 1 tablet (4 mg total) by mouth every 8 (eight) hours as needed for nausea or vomiting. 07/14/18   Duffy Bruce, MD  oxyCODONE-acetaminophen (PERCOCET/ROXICET) 5-325 MG tablet Take 1-2 tablets by mouth every 6 (six) hours as needed for moderate pain or severe pain. 07/14/18   Duffy Bruce, MD    Family History Family History  Problem Relation Age of Onset  . Arthritis Other        both parents & grandparents  . Heart disease Other        grandparents  . Hyperlipidemia Other        grandparents  . Diabetes Paternal Aunt   . Hypertension Maternal Grandmother   . Anesthesia problems Neg Hx   . Hypotension Neg Hx   . Malignant hyperthermia Neg Hx   . Pseudochol deficiency Neg Hx  Social History Social History   Tobacco Use  . Smoking status: Former Smoker    Packs/day: 1.00    Years: 8.00    Pack years: 8.00    Types: Cigarettes    Last attempt to quit: 02/01/2018    Years since quitting: 0.4  . Smokeless tobacco: Never Used  Substance Use Topics  . Alcohol use: Yes    Comment: occasional  . Drug use: No     Allergies   Raspberry   Review of Systems Review of Systems  Constitutional: Positive for fatigue. Negative for chills and fever.  HENT: Negative for congestion and rhinorrhea.   Eyes: Negative for visual disturbance.  Respiratory: Negative for cough, shortness of breath and wheezing.   Cardiovascular: Negative for chest  pain and leg swelling.  Gastrointestinal: Positive for abdominal pain and nausea. Negative for diarrhea and vomiting.  Genitourinary: Negative for dysuria and flank pain.  Musculoskeletal: Negative for neck pain and neck stiffness.  Skin: Negative for rash and wound.  Allergic/Immunologic: Negative for immunocompromised state.  Neurological: Positive for weakness. Negative for syncope and headaches.  All other systems reviewed and are negative.    Physical Exam Updated Vital Signs BP 125/87   Pulse 67   Temp 98.6 F (37 C) (Oral)   Resp 16   Ht 5\' 9"  (1.753 m)   Wt 136.1 kg (300 lb)   LMP  (LMP Unknown)   SpO2 94%   BMI 44.30 kg/m   Physical Exam  Constitutional: She is oriented to person, place, and time. She appears well-developed and well-nourished. No distress.  HENT:  Head: Normocephalic and atraumatic.  Eyes: Conjunctivae are normal.  Neck: Neck supple.  Cardiovascular: Normal rate, regular rhythm and normal heart sounds. Exam reveals no friction rub.  No murmur heard. Pulmonary/Chest: Effort normal and breath sounds normal. No respiratory distress. She has no wheezes. She has no rales.  Abdominal: She exhibits no distension. There is tenderness in the right lower quadrant, suprapubic area and left lower quadrant. There is no rigidity, no rebound and no guarding.  Musculoskeletal: She exhibits no edema.  Neurological: She is alert and oriented to person, place, and time. She exhibits normal muscle tone.  Skin: Skin is warm. Capillary refill takes less than 2 seconds.  Psychiatric: She has a normal mood and affect.  Nursing note and vitals reviewed.    ED Treatments / Results  Labs (all labs ordered are listed, but only abnormal results are displayed) Labs Reviewed  COMPREHENSIVE METABOLIC PANEL - Abnormal; Notable for the following components:      Result Value   ALT 45 (*)    All other components within normal limits  CBC WITH DIFFERENTIAL/PLATELET  LIPASE,  BLOOD  URINALYSIS, ROUTINE W REFLEX MICROSCOPIC    EKG None  Radiology US Transvaginal Non-ob  Result Date: 07/14/2018 CLINICAL DATA:  Pelvic pain x2 weeks, prior left oophorectomy, abnormal CT EXAM: TRANSABDOMINAL AND TRANSVAGINAL ULTRASOUND OF PELVIS DOPPLER ULTRASOUND OF OVARIES TECHNIQUE: Both transabdominal and transvaginal ultrasound examinations of the pelvis were performed. Transabdominal technique was performed for global imaging of the pelvis including uterus, ovaries, adnexal regions, and pelvic cul-de-sac. It was necessary to proceed with endovaginal exam following the transabdominal exam to visualize the endometrium. Color and duplex Doppler ultrasound was utilized to evaluate blood flow to the ovaries. COMPARISON:  Pelvic ultrasound dated 07/05/2018. CT abdomen/pelvis dated 07/05/2018. FINDINGS: Uterus Measurements: 7.9 x 3.6 x 4.1 cm. No fibroids or other mass visualized. Endometrium Thickness: 8 mm.  No focal abnormality visualized. Right ovary Measurements: 5.0 x 3.5 x 6.2 cm. 4.2 x 2.8 x 3.4 cm complex cystic/solid right adnexal mass with color Doppler flow. Additional 2.8 x 1.8 x 2.6 cm cystic/solid right adnexal mass with color Doppler flow. Left ovary Reportedly surgically absent. 4.5 x 3.6 x 4.0 cm complex cystic/solid left adnexal mass with color Doppler flow. Pulsed Doppler evaluation of the right ovary demonstrates normal low-resistance arterial and venous waveforms. Other findings Possible additional right adnexal lesions remote from the right ovary, although poorly discriminated from adjacent structures. IMPRESSION: No evidence of right ovarian torsion. Findings are unchanged from recent pelvic ultrasound. Complex cystic/solid bilateral adnexal masses, as described above. GYN consultation for surgical evaluation is suggested. If further imaging characterization is desired, consider outpatient MR pelvis with/without contrast. Electronically Signed   By: Julian Hy M.D.   On:  07/14/2018 20:32   US Pelvis Complete  Result Date: 07/14/2018 CLINICAL DATA:  Pelvic pain x2 weeks, prior left oophorectomy, abnormal CT EXAM: TRANSABDOMINAL AND TRANSVAGINAL ULTRASOUND OF PELVIS DOPPLER ULTRASOUND OF OVARIES TECHNIQUE: Both transabdominal and transvaginal ultrasound examinations of the pelvis were performed. Transabdominal technique was performed for global imaging of the pelvis including uterus, ovaries, adnexal regions, and pelvic cul-de-sac. It was necessary to proceed with endovaginal exam following the transabdominal exam to visualize the endometrium. Color and duplex Doppler ultrasound was utilized to evaluate blood flow to the ovaries. COMPARISON:  Pelvic ultrasound dated 07/05/2018. CT abdomen/pelvis dated 07/05/2018. FINDINGS: Uterus Measurements: 7.9 x 3.6 x 4.1 cm. No fibroids or other mass visualized. Endometrium Thickness: 8 mm.  No focal abnormality visualized. Right ovary Measurements: 5.0 x 3.5 x 6.2 cm. 4.2 x 2.8 x 3.4 cm complex cystic/solid right adnexal mass with color Doppler flow. Additional 2.8 x 1.8 x 2.6 cm cystic/solid right adnexal mass with color Doppler flow. Left ovary Reportedly surgically absent. 4.5 x 3.6 x 4.0 cm complex cystic/solid left adnexal mass with color Doppler flow. Pulsed Doppler evaluation of the right ovary demonstrates normal low-resistance arterial and venous waveforms. Other findings Possible additional right adnexal lesions remote from the right ovary, although poorly discriminated from adjacent structures. IMPRESSION: No evidence of right ovarian torsion. Findings are unchanged from recent pelvic ultrasound. Complex cystic/solid bilateral adnexal masses, as described above. GYN consultation for surgical evaluation is suggested. If further imaging characterization is desired, consider outpatient MR pelvis with/without contrast. Electronically Signed   By: Julian Hy M.D.   On: 07/14/2018 20:32   [place] Art/ven Flow Abd Pelv  Doppler  Result Date: 07/14/2018 CLINICAL DATA:  Pelvic pain x2 weeks, prior left oophorectomy, abnormal CT EXAM: TRANSABDOMINAL AND TRANSVAGINAL ULTRASOUND OF PELVIS DOPPLER ULTRASOUND OF OVARIES TECHNIQUE: Both transabdominal and transvaginal ultrasound examinations of the pelvis were performed. Transabdominal technique was performed for global imaging of the pelvis including uterus, ovaries, adnexal regions, and pelvic cul-de-sac. It was necessary to proceed with endovaginal exam following the transabdominal exam to visualize the endometrium. Color and duplex Doppler ultrasound was utilized to evaluate blood flow to the ovaries. COMPARISON:  Pelvic ultrasound dated 07/05/2018. CT abdomen/pelvis dated 07/05/2018. FINDINGS: Uterus Measurements: 7.9 x 3.6 x 4.1 cm. No fibroids or other mass visualized. Endometrium Thickness: 8 mm.  No focal abnormality visualized. Right ovary Measurements: 5.0 x 3.5 x 6.2 cm. 4.2 x 2.8 x 3.4 cm complex cystic/solid right adnexal mass with color Doppler flow. Additional 2.8 x 1.8 x 2.6 cm cystic/solid right adnexal mass with color Doppler flow. Left ovary Reportedly surgically absent. 4.5 x 3.6  x 4.0 cm complex cystic/solid left adnexal mass with color Doppler flow. Pulsed Doppler evaluation of the right ovary demonstrates normal low-resistance arterial and venous waveforms. Other findings Possible additional right adnexal lesions remote from the right ovary, although poorly discriminated from adjacent structures. IMPRESSION: No evidence of right ovarian torsion. Findings are unchanged from recent pelvic ultrasound. Complex cystic/solid bilateral adnexal masses, as described above. GYN consultation for surgical evaluation is suggested. If further imaging characterization is desired, consider outpatient MR pelvis with/without contrast. Electronically Signed   By: Julian Hy M.D.   On: 07/14/2018 20:32    Procedures Procedures (including critical care time)  Medications  Ordered in ED Medications  oxyCODONE-acetaminophen (PERCOCET/ROXICET) 5-325 MG per tablet 2 tablet (2 tablets Oral Refused 07/14/18 2110)  HYDROmorphone (DILAUDID) injection 1 mg (1 mg Intravenous Given 07/14/18 1815)  ondansetron (ZOFRAN) injection 4 mg (4 mg Intravenous Given 07/14/18 1815)  sodium chloride 0.9 % bolus 1,000 mL (0 mLs Intravenous Stopped 07/14/18 1916)  ketorolac (TORADOL) 15 MG/ML injection 15 mg (15 mg Intravenous Given 07/14/18 2105)     Initial Impression / Assessment and Plan / ED Course  I have reviewed the triage vital signs and the nursing notes.  Pertinent labs & imaging results that were available during my care of the patient were reviewed by me and considered in my medical decision making (see chart for details).     32 year old female here with acute on chronic lower abdominal pain.  History of ovarian cancer and was recently diagnosed with large cystic masses concerning for recurrence.  Ultrasound today shows no acute changes in her mass, hemorrhage, or torsion.  She has already seen her OB for this and is being appropriately worked up.  Her lab work is reassuring. No urinary sx. No vaginal bleeding, discharge, or s/s PID.  I suspect this is pain related to her new ovarian cyst/mass.  I discussed her findings and encouraged her to contact her OB with the results today.  Will give her a course of analgesics for this, antiemetics, and discharge home.  Final Clinical Impressions(s) / ED Diagnoses   Final diagnoses:  Pelvic pain  Lower abdominal pain    ED Discharge Orders        Ordered    oxyCODONE-acetaminophen (PERCOCET/ROXICET) 5-325 MG tablet  Every 6 hours PRN     07/14/18 2124    ondansetron (ZOFRAN ODT) 4 MG disintegrating tablet  Every 8 hours PRN     07/14/18 2124    docusate sodium (COLACE) 250 MG capsule  Daily     07/14/18 2124       Duffy Bruce, MD 07/14/18 2124

## 2018-07-14 NOTE — ED Triage Notes (Signed)
She c/o rlq area abd. Pain x 2-3 weeks. Seen here recently for same and was dx with ovarian "cysts". She tells me she has prior hx of left ovarian cancer with a "five pound tumor removed" ~ 3 years ago. She is in no distress. She states she is to see Gyn. At the end of this month; however, she is becoming too uncomfortable to wait.

## 2020-10-23 DIAGNOSIS — C562 Malignant neoplasm of left ovary: Secondary | ICD-10-CM | POA: Insufficient documentation

## 2021-10-04 ENCOUNTER — Emergency Department
Admission: EM | Admit: 2021-10-04 | Discharge: 2021-10-05 | Disposition: A | Discharge status: Home / Self Care | Payer: Medicaid - Out of State | Attending: Emergency Medicine | Admitting: Emergency Medicine

## 2021-10-04 ENCOUNTER — Encounter: Payer: Self-pay | Admitting: *Deleted

## 2021-10-04 ENCOUNTER — Other Ambulatory Visit: Payer: Self-pay

## 2021-10-04 DIAGNOSIS — Z87891 Personal history of nicotine dependence: Secondary | ICD-10-CM | POA: Diagnosis not present

## 2021-10-04 DIAGNOSIS — F319 Bipolar disorder, unspecified: Secondary | ICD-10-CM | POA: Diagnosis present

## 2021-10-04 DIAGNOSIS — R45851 Suicidal ideations: Secondary | ICD-10-CM | POA: Diagnosis not present

## 2021-10-04 DIAGNOSIS — Z20822 Contact with and (suspected) exposure to covid-19: Secondary | ICD-10-CM | POA: Diagnosis not present

## 2021-10-04 DIAGNOSIS — Z8543 Personal history of malignant neoplasm of ovary: Secondary | ICD-10-CM | POA: Insufficient documentation

## 2021-10-04 DIAGNOSIS — F4310 Post-traumatic stress disorder, unspecified: Secondary | ICD-10-CM | POA: Diagnosis not present

## 2021-10-04 DIAGNOSIS — Z79899 Other long term (current) drug therapy: Secondary | ICD-10-CM | POA: Insufficient documentation

## 2021-10-04 DIAGNOSIS — Z046 Encounter for general psychiatric examination, requested by authority: Secondary | ICD-10-CM | POA: Diagnosis present

## 2021-10-04 DIAGNOSIS — F17200 Nicotine dependence, unspecified, uncomplicated: Secondary | ICD-10-CM | POA: Diagnosis present

## 2021-10-04 LAB — RESP PANEL BY RT-PCR (FLU A&B, COVID) ARPGX2
Influenza A by PCR: NEGATIVE
Influenza B by PCR: NEGATIVE
SARS Coronavirus 2 by RT PCR: NEGATIVE

## 2021-10-04 LAB — COMPREHENSIVE METABOLIC PANEL
ALT: 11 U/L (ref 0–44)
AST: 12 U/L — ABNORMAL LOW (ref 15–41)
Albumin: 4.4 g/dL (ref 3.5–5.0)
Alkaline Phosphatase: 46 U/L (ref 38–126)
Anion gap: 7 (ref 5–15)
BUN: 9 mg/dL (ref 6–20)
CO2: 22 mmol/L (ref 22–32)
Calcium: 9.2 mg/dL (ref 8.9–10.3)
Chloride: 109 mmol/L (ref 98–111)
Creatinine, Ser: 0.81 mg/dL (ref 0.44–1.00)
GFR, Estimated: >60 mL/min (ref >60)
Glucose, Bld: 90 mg/dL (ref 70–99)
Potassium: 3.7 mmol/L (ref 3.5–5.1)
Sodium: 138 mmol/L (ref 135–145)
Total Bilirubin: 0.6 mg/dL (ref 0.3–1.2)
Total Protein: 9 g/dL — ABNORMAL HIGH (ref 6.5–8.1)

## 2021-10-04 LAB — CBC
HCT: 43.7 % (ref 36.0–46.0)
Hemoglobin: 15.3 g/dL — ABNORMAL HIGH (ref 12.0–15.0)
MCH: 32.6 pg (ref 26.0–34.0)
MCHC: 35 g/dL (ref 30.0–36.0)
MCV: 93.2 fL (ref 80.0–100.0)
Platelets: 249 10*3/uL (ref 150–400)
RBC: 4.69 MIL/uL (ref 3.87–5.11)
RDW: 13.2 % (ref 11.5–15.5)
WBC: 12.9 10*3/uL — ABNORMAL HIGH (ref 4.0–10.5)
nRBC: 0 % (ref 0.0–0.2)

## 2021-10-04 LAB — URINE DRUG SCREEN, QUALITATIVE (ARMC ONLY)
Amphetamines, Ur Screen: NOT DETECTED
Barbiturates, Ur Screen: NOT DETECTED
Benzodiazepine, Ur Scrn: NOT DETECTED
Cannabinoid 50 Ng, Ur ~~LOC~~: POSITIVE — AB
Cocaine Metabolite,Ur ~~LOC~~: NOT DETECTED
MDMA (Ecstasy)Ur Screen: NOT DETECTED
Methadone Scn, Ur: NOT DETECTED
Opiate, Ur Screen: NOT DETECTED
Phencyclidine (PCP) Ur S: NOT DETECTED
Tricyclic, Ur Screen: NOT DETECTED

## 2021-10-04 LAB — POC URINE PREG, ED: Preg Test, Ur: NEGATIVE

## 2021-10-04 LAB — SALICYLATE LEVEL: Salicylate Lvl: <7 mg/dL — ABNORMAL LOW (ref 7.0–30.0)

## 2021-10-04 LAB — ACETAMINOPHEN LEVEL: Acetaminophen (Tylenol), Serum: <10 ug/mL — ABNORMAL LOW (ref 10–30)

## 2021-10-04 LAB — ETHANOL: Alcohol, Ethyl (B): <10 mg/dL (ref <10)

## 2021-10-04 NOTE — ED Notes (Signed)
Pt given food tray and drink by NT.

## 2021-10-04 NOTE — ED Triage Notes (Signed)
Pt reports SI. No etoh use today.  Used Marijuana yesterday.  Denies HI.  Pt off depression meds for 3 months.  Pt calm and cooperative.

## 2021-10-04 NOTE — ED Notes (Signed)
Pt. To BHU from ED ambulatory without difficulty, to room  BHU 3. Report from [facility]. Pt. Is alert and oriented, warm and dry in no distress. Pt. Denies SI, HI, and AVH. Pt. Calm and cooperative. Pt gave Probation officer permission to call boyfriend to tell him where their room key is located. Pt. Made aware of security cameras and Q15 minute rounds. Pt. Encouraged to let Nursing staff know of any concerns or needs.

## 2021-10-04 NOTE — ED Notes (Signed)
Poct pregnancy Negative 

## 2021-10-04 NOTE — ED Notes (Signed)
Pt on way to [place]. Kim notified via phone.
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
## 2021-10-04 NOTE — ED Notes (Signed)

## 2021-10-04 NOTE — ED Notes (Signed)
VOL/  PENDING  CONSULT 
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
## 2021-10-04 NOTE — ED Notes (Addendum)
Black leggings [employer] Black bra Black zipper jacket Keys Cig lighter Green panties [employer]

## 2021-10-05 ENCOUNTER — Other Ambulatory Visit: Payer: Self-pay

## 2021-10-05 DIAGNOSIS — F319 Bipolar disorder, unspecified: Secondary | ICD-10-CM

## 2021-10-05 MED ORDER — LAMOTRIGINE 25 MG PO TABS: 25.0000 mg | ORAL_TABLET | Freq: Every day | ORAL | 1 refills | Status: DC

## 2021-10-05 MED ORDER — SERTRALINE HCL 50 MG PO TABS: 25.0000 mg | ORAL_TABLET | Freq: Every day | ORAL | 1 refills | Status: DC

## 2021-10-05 MED ORDER — LAMOTRIGINE 25 MG PO TABS
25.0000 mg | ORAL_TABLET | Freq: Every day | ORAL | 1 refills | Status: DC
  Filled 2021-10-05: qty 30, 30d supply, fill #0

## 2021-10-05 MED ORDER — BUSPIRONE HCL 10 MG PO TABS: 10.0000 mg | ORAL_TABLET | Freq: Every day | ORAL | 1 refills | Status: DC

## 2021-10-05 MED ORDER — BUSPIRONE HCL 10 MG PO TABS
10.0000 mg | ORAL_TABLET | Freq: Every day | ORAL | 1 refills | Status: DC
  Filled 2021-10-05: qty 30, 30d supply, fill #0

## 2021-10-05 MED ORDER — SERTRALINE HCL 50 MG PO TABS
25.0000 mg | ORAL_TABLET | Freq: Every day | ORAL | 1 refills | Status: DC
  Filled 2021-10-05: qty 15, 30d supply, fill #0

## 2021-10-05 MED ORDER — GUANFACINE HCL 1 MG PO TABS
1.0000 mg | ORAL_TABLET | Freq: Every day | ORAL | 1 refills | Status: DC
  Filled 2021-10-05 – 2021-10-06 (×4): qty 30, 30d supply, fill #0

## 2021-10-05 MED ORDER — GUANFACINE HCL 1 MG PO TABS: 1.0000 mg | ORAL_TABLET | Freq: Every day | ORAL | 1 refills | Status: DC

## 2021-10-05 MED ORDER — NICOTINE 21 MG/24HR TD PT24
21.0000 mg | MEDICATED_PATCH | Freq: Once | TRANSDERMAL | Status: DC
  Administered 2021-10-05: 21 mg via TRANSDERMAL
  Filled 2021-10-05: qty 1

## 2021-10-05 NOTE — ED Notes (Signed)
Pt discharged home.  All belongings returned to pt including key, cell phone and lighter.  Pt denies SI.

## 2021-10-05 NOTE — ED Notes (Signed)
Meal tray given 

## 2021-10-05 NOTE — ED Notes (Signed)
Pt given the phone by NP to call SW.

## 2021-10-05 NOTE — ED Provider Notes (Signed)
Emergency Medicine Observation Re-evaluation Note  Jacqueline Morrow is a 35 y.o. female, seen on rounds today.  Pt initially presented to the ED for complaints of Psychiatric Evaluation Currently, the patient is calm, resting.  Physical Exam  BP 138/88 (BP Location: Left Arm)   Pulse 77   Temp 98.6 F (37 C) (Oral)   Resp 18   Ht 5\' 9"  (1.753 m)   Wt 111.1 kg   LMP 08/31/2021 (Approximate)   SpO2 99%   BMI 36.18 kg/m    ED Course / MDM  EKG:   I have reviewed the labs performed to date as well as medications administered while in observation.  Recent changes in the last 24 hours include none.  Plan  Current plan is for psychiatric disposition, suspected discharge.  Jacqueline Morrow is not under involuntary commitment.     Duffy Bruce, MD 10/05/21 [phone]

## 2021-10-05 NOTE — ED Notes (Signed)
VOL/Pending D/C with CSW assistance

## 2021-10-05 NOTE — TOC Progression Note (Signed)
Transition of Care [facility]) - Progression Note    Patient Details  Name: TYYONNA SOUCY MRN: [id number] Date of Birth: [birth year]  Transition of Care [facility]) CM/SW [place], [place] Phone Number: 10/05/2021, 10:02 AM  Clinical Narrative:     CSW gave BHUED RN for patient to contact CSW.  Patient refused to speak with CSW.  CSW updated EDBHU RN and EDP, the patient can have her prescription sent to [facility] and she will receive an application there. ED BHU RN acknowledged CSW update.     Barriers to Discharge: No Barriers Identified, ED Active Substance Abuse  Expected Discharge Plan and Services                                                 Social Determinants of Health (SDOH) Interventions    Readmission Risk Interventions No flowsheet data found.

## 2021-10-05 NOTE — ED Notes (Signed)
Pt given phone and the number of the [facility] social worker.
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
## 2021-10-06 ENCOUNTER — Other Ambulatory Visit: Payer: Self-pay

## 2021-10-07 ENCOUNTER — Other Ambulatory Visit: Payer: Self-pay

## 2021-11-15 ENCOUNTER — Emergency Department
Admission: EM | Admit: 2021-11-15 | Discharge: 2021-11-15 | Disposition: A | Discharge status: Home / Self Care | Payer: Self-pay | Attending: Emergency Medicine | Admitting: Emergency Medicine

## 2021-11-15 ENCOUNTER — Emergency Department: Payer: Self-pay

## 2021-11-15 ENCOUNTER — Other Ambulatory Visit: Payer: Self-pay

## 2021-11-15 DIAGNOSIS — M546 Pain in thoracic spine: Secondary | ICD-10-CM | POA: Insufficient documentation

## 2021-11-15 DIAGNOSIS — M549 Dorsalgia, unspecified: Secondary | ICD-10-CM

## 2021-11-15 DIAGNOSIS — Z8543 Personal history of malignant neoplasm of ovary: Secondary | ICD-10-CM | POA: Insufficient documentation

## 2021-11-15 DIAGNOSIS — G8929 Other chronic pain: Secondary | ICD-10-CM | POA: Insufficient documentation

## 2021-11-15 DIAGNOSIS — Z87891 Personal history of nicotine dependence: Secondary | ICD-10-CM | POA: Insufficient documentation

## 2021-11-15 MED ORDER — IBUPROFEN 400 MG PO TABS
400.0000 mg | ORAL_TABLET | Freq: Once | ORAL | Status: AC
  Administered 2021-11-15: 400 mg via ORAL
  Filled 2021-11-15: qty 1

## 2021-11-15 NOTE — ED Provider Notes (Signed)
[facility]  ____________________________________________   Event Date/Time   First MD Initiated Contact with Patient 11/15/21 1401     (approximate)  I have reviewed the triage vital signs and the nursing notes.   HISTORY  Chief Complaint Back Pain    HPI Jacqueline Morrow is a 35 y.o. female with past medical history of bipolar disorder, arthritis, variant cancer who presents with back pain.  Pain is located in the mid thoracic region and does not radiate.  She has had this pain off and on for 6 months but today worsened when she was stretching.  Pain is constant.  She denies numbness tingling weakness in her upper extremities.  She also has chronic left-sided sciatica with some numbness in the left leg but this is unchanged.  Denies bowel or bladder incontinence.  Denies fevers or chills.  Patient does have history of ovarian cancer with recurrence, last had surgery back in December of this year.         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 Authorizing Provider  busPIRone (BUSPAR) 10 MG tablet Take 1 tablet (10 mg total) by mouth once daily.  10/05/21   Clapacs, Madie Reno, MD  diphenhydramine-acetaminophen (TYLENOL PM) 25-500 MG TABS tablet Take 2 tablets by mouth at bedtime as needed (sleep/pain).    [provider]  docusate sodium (COLACE) 250 MG capsule Take 1 capsule (250 mg total) by mouth daily. 07/14/18   Duffy Bruce, MD  guanFACINE (TENEX) 1 MG tablet Take 1 tablet (1 mg total) by mouth once daily. 10/05/21   Clapacs, Madie Reno, MD  lamoTRIgine (LAMICTAL) 25 MG tablet Take 1 tablet (25 mg total) by mouth once daily. 10/05/21 10/05/22  Clapacs, Madie Reno, MD  metroNIDAZOLE (FLAGYL) 500 MG tablet Take 1 tablet (500 mg total) by mouth 2 (two) times daily. 07/01/18   Glyn Ade, PA-C  ondansetron (ZOFRAN ODT) 4 MG disintegrating tablet Take 1 tablet (4 mg total) by mouth every 8 (eight) hours as needed for nausea or vomiting. 07/14/18   Duffy Bruce, MD  oxyCODONE-acetaminophen (PERCOCET/ROXICET) 5-325 MG tablet Take 1-2 tablets by mouth every 6 (six) hours as needed for moderate pain or severe pain. 07/14/18   Duffy Bruce, MD  sertraline (ZOLOFT) 50 MG tablet Take (1/2) tablet (25 mg total) by mouth once daily. 10/05/21 10/05/22  Clapacs, Madie Reno, MD    Allergies Raspberry  Family History  Problem Relation Age of Onset   Arthritis Other        both parents & grandparents  Heart disease Other        grandparents   Hyperlipidemia Other        grandparents   Diabetes Paternal Aunt    Hypertension Maternal Grandmother    Anesthesia problems Neg Hx    Hypotension Neg Hx    Malignant hyperthermia Neg Hx    Pseudochol deficiency Neg Hx     Social History Social History   Tobacco Use   Smoking status: Former    Packs/day: 1.00    Years: 8.00    Pack years: 8.00    Types: Cigarettes    Quit date: 02/01/2018    Years since quitting: 3.7   Smokeless tobacco: Never  Vaping Use   Vaping Use: Every day  Substance Use Topics   Alcohol use: Yes    Comment: occasional   Drug use: No    Review of Systems    Review of Systems  Constitutional:  Negative for chills and fever.  Respiratory:  Negative for shortness of breath.   Cardiovascular:  Negative for chest pain.  Musculoskeletal:  Positive for arthralgias and back pain.  All other systems reviewed and are negative.  Physical Exam Updated Vital Signs BP 119/76 (BP Location: Left Arm)   Pulse 66   Temp 98.5 F (36.9 C) (Oral)   Resp 20   Ht 5\' 9"  (1.753 m)   Wt 108.9 kg   LMP 11/15/2021   SpO2 99%   BMI 35.44 kg/m   Physical Exam   LABS (all labs ordered are listed, but only abnormal results are displayed)  Labs Reviewed - No data to display ____________________________________________  EKG  N/a ____________________________________________  RADIOLOGY Almeta Monas, personally viewed and evaluated these images (plain radiographs) as part of my medical decision making, as well as reviewing the written report by the radiologist.  ED MD interpretation:    I reviewed the CT of the thoracic and lumbar spine which is negative for acute fracture    ____________________________________________   PROCEDURES  Procedure(s) performed (including Critical Care):  Procedures   ____________________________________________   INITIAL IMPRESSION / ASSESSMENT AND PLAN / ED COURSE   Patient is a 35 year old female with a history of recurrent ovarian cancer who presents with back pain.  Pain is located in the mid thoracic region is midline without associated neurologic symptoms.  She has had weight loss but no fevers chills or other B symptoms.  Vital signs within normal limits.  On exam she is tender in the midline around the mid to low thoracic region.  She has full strength and sensation in her bilateral upper and lower extremities.  There was no preceding trauma.  I am concerned given her cancer history that she could have bony mets or other pathologic fracture so I did obtain a CT of the thoracic and lumbar spines which  are negative for acute fracture.  There is degenerative changes and some canal stenosis.  Discussed findings with the patient recommended she follow-up with neurosurgery given this pain is been going on for 6 months.  No indication for emergent MRI given no symptoms of cord compression.  Will discharge with NSAIDs.      ____________________________________________   FINAL CLINICAL IMPRESSION(S) / ED DIAGNOSES  Final diagnoses:  Chronic midline back pain, unspecified back location     ED Discharge Orders     None        Note:  This document was prepared using Dragon voice recognition software and may include unintentional dictation  errors.    Rada Hay, MD 11/15/21 [phone]

## 2021-11-15 NOTE — ED Triage Notes (Signed)
Pt in with co back pain hx of sciatica and arthritis. States started 6 months ago, today when she stretched pain became worse.

## 2021-11-15 NOTE — ED Notes (Signed)
Pt to ED for upper back pain. Normally suffers from chronic lower back pain and has had arthritis in back since was teenager. Pt is able to walk without aids.

## 2021-11-19 ENCOUNTER — Other Ambulatory Visit: Payer: Self-pay

## 2022-01-04 ENCOUNTER — Telehealth: Payer: Self-pay | Admitting: Pharmacy Technician

## 2022-01-04 NOTE — Telephone Encounter (Signed)
Patient failed to provide 2023 proof of income.  No additional medication assistance will be provided by [facility] without the required proof of income documentation.  Patient notified by letter.  [place], [place]  [id number]  January 03, 2022    Jacqueline Morrow [address] [place], [place]  [address]  Dear Jacqueline Morrow:  This is to inform you that you are no longer eligible to receive medication assistance at [facility].  The reason(s) are:    _____Your total gross monthly household income exceeds 300% of the Federal Poverty Level.   _____Tangible assets (savings, checking, stocks/bonds, pension, retirement, etc.) exceeds our limit  _____You are eligible to receive benefits from [facility], [facility] or HIV Medication              Assistance Program _____You are eligible to receive benefits from a Medicare Part D plan _____You have prescription insurance  _____You are not an [facility] resident __X__Failure to provide all requested documentation (proof of income for 2023, and/or Patient Intake Application, DOH Attestation, Contract, etc).    Medication assistance will resume once all requested documentation has been returned to our clinic.  If you have questions, please contact our clinic at [phone].    Thank you,  [facility]

## 2022-01-25 ENCOUNTER — Other Ambulatory Visit: Payer: Self-pay

## 2022-01-27 ENCOUNTER — Ambulatory Visit: Payer: Self-pay | Admitting: Family Medicine

## 2022-01-27 ENCOUNTER — Other Ambulatory Visit: Payer: Self-pay

## 2022-01-27 ENCOUNTER — Encounter: Payer: Self-pay | Admitting: Family Medicine

## 2022-01-27 DIAGNOSIS — Z3200 Encounter for pregnancy test, result unknown: Secondary | ICD-10-CM

## 2022-01-27 DIAGNOSIS — Z3201 Encounter for pregnancy test, result positive: Secondary | ICD-10-CM

## 2022-01-27 DIAGNOSIS — Z113 Encounter for screening for infections with a predominantly sexual mode of transmission: Secondary | ICD-10-CM

## 2022-01-27 LAB — WET PREP FOR TRICH, YEAST, CLUE
Trichomonas Exam: NEGATIVE
Yeast Exam: NEGATIVE

## 2022-01-27 LAB — PREGNANCY, URINE: Preg Test, Ur: POSITIVE — AB

## 2022-01-27 LAB — HM HIV SCREENING LAB: HM HIV Screening: NEGATIVE

## 2022-01-27 LAB — HM HEPATITIS C SCREENING LAB: HM Hepatitis Screen: NEGATIVE

## 2022-01-27 MED ORDER — PRENATAL VITAMIN 27-0.8 MG PO TABS
1.0000 | ORAL_TABLET | ORAL | 0 refills | Status: AC
Start: 2022-01-27 — End: 2022-01-28

## 2022-01-27 NOTE — Progress Notes (Signed)
[facility] problem visit  [facility]  Subjective:  Jacqueline Morrow is a 36 y.o. being seen today for   Chief Complaint  Patient presents with   SEXUALLY TRANSMITTED DISEASE    screening    HPI   Does the patient have a current or past history of drug use? Yes   No components found for: HCV]   Health Maintenance Due  Topic Date Due   COVID-19 Vaccine (1) Never done   HIV Screening  Never done   Hepatitis C Screening  Never done   TETANUS/TDAP  Never done   PAP SMEAR-Modifier  10/16/2015   INFLUENZA VACCINE  Never done    ROS  The following portions of the patient's history were reviewed and updated as appropriate: allergies, current medications, past family history, past medical history, past social history, past surgical history and problem list. Problem list updated.   See flowsheet for other program required questions.  Objective:  There were no vitals filed for this visit.  Physical Exam Vitals and nursing note reviewed.  Constitutional:      Appearance: Normal appearance.  HENT:     Head: Normocephalic and atraumatic.     Mouth/Throat:     Mouth: Mucous membranes are moist.     Dentition: Abnormal dentition. Dental caries present.     Pharynx: Oropharynx is clear. No oropharyngeal exudate or posterior oropharyngeal erythema.     Comments: Very poor dental care and hygiene  Pulmonary:     Effort: Pulmonary effort is normal.  Abdominal:     General: Abdomen is flat.     Palpations: There is no mass.     Tenderness: There is no abdominal tenderness. There is no rebound.  Genitourinary:    General: Normal vulva.     Exam position: Lithotomy position.     Pubic Area: No rash or pubic lice.      Labia:        Right: No rash or lesion.        Left: No rash or lesion.      Vagina: Normal. No vaginal discharge, erythema, bleeding or lesions.     Cervix: No cervical motion tenderness, discharge, friability, lesion  or erythema.     Uterus: Normal.      Adnexa: Right adnexa normal and left adnexa normal.     Rectum: Normal.  Lymphadenopathy:     Head:     Right side of head: No preauricular or posterior auricular adenopathy.     Left side of head: No preauricular or posterior auricular adenopathy.     Cervical: No cervical adenopathy.     Upper Body:     Right upper body: No supraclavicular or axillary adenopathy.     Left upper body: No supraclavicular or axillary adenopathy.     Lower Body: No right inguinal adenopathy. No left inguinal adenopathy.  Skin:    General: Skin is warm and dry.     Findings: No rash.  Neurological:     Mental Status: She is alert and oriented to person, place, and time.      Assessment and Plan:  Jacqueline Morrow is a 36 y.o. female presenting to the [facility] Department for a Women's Health problem visit  1. Screening examination for venereal disease Patient accepted all screenings including wet prep, oral, vaginal CT/GC and bloodwork for HIV/RPR.  Patient meets criteria for HepB screening? Yes. Ordered? Yes Patient meets criteria for HepC  screening? Yes. Ordered? Yes  Wet prep results   neg  Treatment needed  Discussed time line for State [facility] results and that patient will be called with positive results and encouraged patient to call if she had not heard in 2 weeks.  Counseled to return or seek care for continued or worsening symptoms Recommended condom use with all sex  Patient is currently using  no bcm   to prevent pregnancy.   - Chlamydia/Gonorrhea [place] [facility] - HBV Antigen/Antibody [facility] - HIV/HCV [place] [facility] - Syphilis Serology, [place] [facility] - WET PREP FOR [place], YEAST, [place] [facility]  2. Encounter for pregnancy test, result unknown Pt had two positive PT from home and desire for PT today.   - Pregnancy, urine     No follow-ups on file.  No future appointments.  Junious Dresser, FNP

## 2022-01-27 NOTE — Addendum Note (Signed)
Addended by: Windle Guard on: 01/27/2022 04:43 PM   Modules accepted: Orders

## 2022-01-27 NOTE — Progress Notes (Signed)
Pt here for STD screening.  Wet mount results reviewed, no treatment required per SO.  Pt notified of Positive PT results.  Prenatal vitamins and pregnancy information given to pt.  Windle Guard, RN

## 2022-02-07 ENCOUNTER — Other Ambulatory Visit: Payer: Self-pay

## 2022-02-07 ENCOUNTER — Emergency Department: Payer: Medicaid Other

## 2022-02-07 ENCOUNTER — Emergency Department
Admission: EM | Admit: 2022-02-07 | Discharge: 2022-02-07 | Disposition: A | Discharge status: Home / Self Care | Payer: Medicaid Other | Attending: Emergency Medicine | Admitting: Emergency Medicine

## 2022-02-07 ENCOUNTER — Encounter: Payer: Self-pay | Admitting: Intensive Care

## 2022-02-07 DIAGNOSIS — O4690 Antepartum hemorrhage, unspecified, unspecified trimester: Secondary | ICD-10-CM

## 2022-02-07 DIAGNOSIS — Z3A01 Less than 8 weeks gestation of pregnancy: Secondary | ICD-10-CM | POA: Diagnosis not present

## 2022-02-07 DIAGNOSIS — Z3200 Encounter for pregnancy test, result unknown: Secondary | ICD-10-CM

## 2022-02-07 DIAGNOSIS — N939 Abnormal uterine and vaginal bleeding, unspecified: Secondary | ICD-10-CM

## 2022-02-07 DIAGNOSIS — O209 Hemorrhage in early pregnancy, unspecified: Secondary | ICD-10-CM | POA: Diagnosis not present

## 2022-02-07 HISTORY — DX: Polycystic ovarian syndrome: E28.2

## 2022-02-07 HISTORY — DX: Malignant neoplasm of unspecified ovary: C56.9

## 2022-02-07 LAB — CBC WITH DIFFERENTIAL/PLATELET
Abs Immature Granulocytes: 0.05 10*3/uL (ref 0.00–0.07)
Basophils Absolute: 0.1 10*3/uL (ref 0.0–0.1)
Basophils Relative: 1 %
Eosinophils Absolute: 0.5 10*3/uL (ref 0.0–0.5)
Eosinophils Relative: 5 %
HCT: 40.4 % (ref 36.0–46.0)
Hemoglobin: 13.4 g/dL (ref 12.0–15.0)
Immature Granulocytes: 0 %
Lymphocytes Relative: 24 %
Lymphs Abs: 2.8 10*3/uL (ref 0.7–4.0)
MCH: 31.4 pg (ref 26.0–34.0)
MCHC: 33.2 g/dL (ref 30.0–36.0)
MCV: 94.6 fL (ref 80.0–100.0)
Monocytes Absolute: 0.7 10*3/uL (ref 0.1–1.0)
Monocytes Relative: 6 %
Neutro Abs: 7.5 10*3/uL (ref 1.7–7.7)
Neutrophils Relative %: 64 %
Platelets: 239 10*3/uL (ref 150–400)
RBC: 4.27 MIL/uL (ref 3.87–5.11)
RDW: 12.9 % (ref 11.5–15.5)
WBC: 11.6 10*3/uL — ABNORMAL HIGH (ref 4.0–10.5)
nRBC: 0 % (ref 0.0–0.2)

## 2022-02-07 LAB — URINALYSIS, COMPLETE (UACMP) WITH MICROSCOPIC
Bilirubin Urine: NEGATIVE
Glucose, UA: NEGATIVE mg/dL
Hgb urine dipstick: NEGATIVE
Ketones, ur: 5 mg/dL — AB
Leukocytes,Ua: NEGATIVE
Nitrite: NEGATIVE
Protein, ur: NEGATIVE mg/dL
Specific Gravity, Urine: 1.028 (ref 1.005–1.030)
pH: 5 (ref 5.0–8.0)

## 2022-02-07 LAB — COMPREHENSIVE METABOLIC PANEL
ALT: 11 U/L (ref 0–44)
AST: 11 U/L — ABNORMAL LOW (ref 15–41)
Albumin: 4 g/dL (ref 3.5–5.0)
Alkaline Phosphatase: 50 U/L (ref 38–126)
Anion gap: 7 (ref 5–15)
BUN: 10 mg/dL (ref 6–20)
CO2: 24 mmol/L (ref 22–32)
Calcium: 9.6 mg/dL (ref 8.9–10.3)
Chloride: 106 mmol/L (ref 98–111)
Creatinine, Ser: 0.82 mg/dL (ref 0.44–1.00)
GFR, Estimated: >60 mL/min (ref >60)
Glucose, Bld: 86 mg/dL (ref 70–99)
Potassium: 4.1 mmol/L (ref 3.5–5.1)
Sodium: 137 mmol/L (ref 135–145)
Total Bilirubin: 0.3 mg/dL (ref 0.3–1.2)
Total Protein: 7.4 g/dL (ref 6.5–8.1)

## 2022-02-07 LAB — ABO/RH: ABO/RH(D): A POS

## 2022-02-07 LAB — HCG, QUANTITATIVE, PREGNANCY: hCG, Beta Chain, Quant, S: 862 m[IU]/mL — ABNORMAL HIGH (ref <5)

## 2022-02-07 NOTE — ED Provider Notes (Signed)
[facility] Provider Note    Event Date/Time   First MD Initiated Contact with Patient 02/07/22 1547     (approximate)   History   Vaginal Bleeding   HPI  Jacqueline Morrow is a 36 y.o. 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  Patient is approximately [redacted] weeks pregnant.  She states she started bleeding after having sex on Friday.  History of 2 miscarriages along with 1live birth.  Patient states she is not having any severe pain but is very concerned she is miscarrying.  No fever or chills.      Physical Exam   Triage Vital Signs: ED Triage Vitals [02/07/22 1434]  Enc Vitals Group     BP 132/83     Pulse Rate 70     Resp 16     Temp 98.3 F (36.8 C)     Temp Source Oral     SpO2 100 %     Weight 235 lb (106.6 kg)     Height 5\' 9"  (1.753 m)     Head Circumference      Peak Flow      Pain Score 4     Pain Loc      Pain Edu?      Excl. in [place]?     Most recent vital signs: Vitals:   02/07/22 1434  BP: 132/83  Pulse: 70  Resp: 16  Temp: 98.3 F (36.8 C)  SpO2: 100%     General: Awake, no distress.   CV:  Good peripheral perfusion. regular rate and  rhythm Resp:  Normal effort. Lungs CTA Abd:  No distention.  Minimally tender Other:      ED Results / Procedures / Treatments   Labs (all labs ordered are listed, but only abnormal results are displayed) Labs Reviewed  CBC WITH DIFFERENTIAL/PLATELET - Abnormal; Notable for the following components:      Result Value   WBC 11.6 (*)    All other components within normal limits  COMPREHENSIVE METABOLIC PANEL - Abnormal; Notable for the following components:   AST 11 (*)    All other components within normal limits  HCG, QUANTITATIVE, PREGNANCY - Abnormal; Notable for the following components:   hCG, Beta Chain, Quant, S 862 (*)    All other components  within normal limits  URINALYSIS, COMPLETE (UACMP) WITH MICROSCOPIC - Abnormal; Notable for the following components:   Color, Urine YELLOW (*)    APPearance HAZY (*)    Ketones, ur 5 (*)    Bacteria, UA MANY (*)    All other components within normal limits  ABO/RH     EKG     RADIOLOGY Ultrasound OB less than 14 weeks    PROCEDURES:   Procedures   MEDICATIONS ORDERED IN ED: Medications - No data to display   IMPRESSION / MDM / [place] / ED COURSE  I reviewed the triage vital signs and the nursing notes.                              Differential diagnosis includes, but is not limited to, vaginal bleeding pregnancy, threatened miscarriage, miscarriage, ectopic  CBC has minimal bump in WBC of 11.5, beta-hCG is 862 which is low.  I do have concerns of the patient is actually  miscarrying.  Blood type is a positive.  Patient will not need RhoGAM.  Ultrasound OB less than 14 weeks.  Review the images of the ultrasound but I do not notice a IUP.  Radiologist Dr. Carolynn Comment called to notify me that there is no IUP and he does see concerns for right adnexal mass.  Is concerned that it is an ectopic.  Since the patient is unassigned, paged Dr. Ouida Sills Dr. Ouida Sills and I discussed the ultrasound.  Gave him the measurements of the adnexal mass noted on ultrasound by the radiologist.  He was given the beta-hCG level.  He was also given her blood type.  He instructed me to have the patient follow-up in his office on Wednesday afternoon for repeat beta-hCG.  He also instructed me to tell her strict precautions for returning.  If she has any abdominal pain especially if it is worsening she needs to return emergency department immediately.  All information was conveyed to the patient.  She is to call his office for an appointment for Wednesday afternoon for a beta-hCG recheck.  Strict instructions to return if any abdominal pain.  Due to the patient only having 1  fallopian tube I told her that if she has any pain at all she should return the emergency department to preserve the tube.  She is in agreement with treatment plan.  Discharged in stable condition.       FINAL CLINICAL IMPRESSION(S) / ED DIAGNOSES   Final diagnoses:  Vaginal bleeding  Vaginal bleeding in pregnancy  Encounter for assessment for suspected ectopic pregnancy     Rx / DC Orders   ED Discharge Orders     None        Note:  This document was prepared using Dragon voice recognition software and may include unintentional dictation errors.    Versie Starks, PA-C 02/07/22 1721    Lucrezia Starch, MD 02/07/22 [phone]

## 2022-02-07 NOTE — ED Triage Notes (Signed)
Patient reports having vaginal bleeding last night after sex that was dark red and then some pink spotting. Cramping started last night and continued through today. Denies vaginal bleeding today.

## 2022-02-07 NOTE — Discharge Instructions (Signed)
Follow-up with Dr. Tonette Bihari office on Wednesday afternoon.  Please call his office in the morning for an appointment.  Tell them that the ER discussed this with him.  He wants you to have a repeat beta hCG to look at your levels and to assess whether this is a possible ectopic pregnancy.  In the meantime, if you develop abdominal pain you should return emergency department immediately.

## 2022-02-07 NOTE — ED Provider Triage Note (Signed)
Emergency Medicine Provider Triage Evaluation Note  Jacqueline Morrow , a 36 y.o. female  was evaluated in triage.  Pt complains of vaginal bleeding pregnancy.  Approximately [redacted] weeks pregnant.  Bleeding started after having sex on Friday.  History of 2 miscarriages and 1 live births..  Review of Systems  Positive: Vaginal bleeding Negative: Fever, chills  Physical Exam  BP 132/83 (BP Location: Left Arm)    Pulse 70    Temp 98.3 F (36.8 C) (Oral)    Resp 16    Ht 5\' 9"  (1.753 m)    Wt 106.6 kg    LMP 12/20/2021 (Exact Date)    SpO2 100%    BMI 34.70 kg/m  Gen:   Awake, no distress   Resp:  Normal effort  MSK:   Moves extremities without difficulty  Other:    Medical Decision Making  Medically screening exam initiated at 2:41 PM.  Appropriate orders placed.  Jacqueline Morrow was informed that the remainder of the evaluation will be completed by another provider, this initial triage assessment does not replace that evaluation, and the importance of remaining in the ED until their evaluation is complete.     Versie Starks, PA-C 02/07/22 1443

## 2022-02-09 ENCOUNTER — Emergency Department
Admission: EM | Admit: 2022-02-09 | Discharge: 2022-02-09 | Disposition: A | Discharge status: Home / Self Care | Payer: Medicaid Other | Attending: Emergency Medicine | Admitting: Emergency Medicine

## 2022-02-09 ENCOUNTER — Other Ambulatory Visit: Payer: Self-pay

## 2022-02-09 ENCOUNTER — Other Ambulatory Visit: Payer: Self-pay | Admitting: Obstetrics and Gynecology

## 2022-02-09 ENCOUNTER — Emergency Department: Payer: Medicaid Other

## 2022-02-09 DIAGNOSIS — O00101 Right tubal pregnancy without intrauterine pregnancy: Secondary | ICD-10-CM | POA: Insufficient documentation

## 2022-02-09 DIAGNOSIS — Z3A01 Less than 8 weeks gestation of pregnancy: Secondary | ICD-10-CM | POA: Diagnosis not present

## 2022-02-09 DIAGNOSIS — O3680X0 Pregnancy with inconclusive fetal viability, not applicable or unspecified: Secondary | ICD-10-CM

## 2022-02-09 DIAGNOSIS — O209 Hemorrhage in early pregnancy, unspecified: Secondary | ICD-10-CM | POA: Diagnosis present

## 2022-02-09 LAB — COMPREHENSIVE METABOLIC PANEL
ALT: 12 U/L (ref 0–44)
AST: 15 U/L (ref 15–41)
Albumin: 4 g/dL (ref 3.5–5.0)
Alkaline Phosphatase: 50 U/L (ref 38–126)
Anion gap: 13 (ref 5–15)
BUN: 7 mg/dL (ref 6–20)
CO2: 19 mmol/L — ABNORMAL LOW (ref 22–32)
Calcium: 9 mg/dL (ref 8.9–10.3)
Chloride: 107 mmol/L (ref 98–111)
Creatinine, Ser: 0.75 mg/dL (ref 0.44–1.00)
GFR, Estimated: >60 mL/min (ref >60)
Glucose, Bld: 93 mg/dL (ref 70–99)
Potassium: 3.5 mmol/L (ref 3.5–5.1)
Sodium: 139 mmol/L (ref 135–145)
Total Bilirubin: 0.3 mg/dL (ref 0.3–1.2)
Total Protein: 7.4 g/dL (ref 6.5–8.1)

## 2022-02-09 LAB — CBC
HCT: 42.6 % (ref 36.0–46.0)
Hemoglobin: 13.9 g/dL (ref 12.0–15.0)
MCH: 30.8 pg (ref 26.0–34.0)
MCHC: 32.6 g/dL (ref 30.0–36.0)
MCV: 94.5 fL (ref 80.0–100.0)
Platelets: 250 10*3/uL (ref 150–400)
RBC: 4.51 MIL/uL (ref 3.87–5.11)
RDW: 12.9 % (ref 11.5–15.5)
WBC: 11.5 10*3/uL — ABNORMAL HIGH (ref 4.0–10.5)
nRBC: 0 % (ref 0.0–0.2)

## 2022-02-09 LAB — HCG, QUANTITATIVE, PREGNANCY: hCG, Beta Chain, Quant, S: 736 m[IU]/mL — ABNORMAL HIGH (ref <5)

## 2022-02-09 MED ORDER — OXYCODONE HCL 5 MG PO TABS: 5.0000 mg | ORAL_TABLET | Freq: Four times a day (QID) | ORAL | 0 refills | Status: DC | PRN

## 2022-02-09 MED ORDER — METHOTREXATE FOR ECTOPIC PREGNANCY
50.0000 mg/m2 | Freq: Once | INTRAMUSCULAR | Status: AC
Start: 2022-02-09 — End: 2022-02-09
  Administered 2022-02-09: 115 mg via INTRAMUSCULAR
  Filled 2022-02-09: qty 4.6

## 2022-02-09 MED ORDER — OXYCODONE HCL 5 MG PO TABS: 5.0000 mg | ORAL_TABLET | Freq: Four times a day (QID) | ORAL | 0 refills | Status: AC | PRN

## 2022-02-09 NOTE — ED Notes (Signed)
Specials called to administer Methotrexate to patient.  Makayla, RN from specials verbalized understanding and is sending staff to administer.  ?

## 2022-02-09 NOTE — ED Notes (Signed)
Lab called and informed of lab add-ons.  Lab verbalized understanding of this information.  ?

## 2022-02-09 NOTE — Consult Note (Signed)
Obstetrics & Gynecology Office Visit   Chief Complaint:  Chief Complaint  Patient presents with   Vaginal Bleeding   The patient is seen in referral at the request of Dr. Nickolas Madrid from [facility] ER for pregnancy of unknown anatomic location.   History of Present Illness: 36 y.o. female who is seen in the ER for pregnancy of unknown anatomic location. She presented two days ago after the onset of severe pain. Her hCG level at that time was 862. She started having some mild cramping and pain after intercourse 6 days ago. This got worse and so she presented to the ER 2 days ago. This is a very desired pregnancy. So, given her clinical stability she was discharged to follow up at [facility] today. However, she had severe pain this morning and called the clinic for instructions. She was instructed to go to the ER.  Her pain has subsided significantly since being in the ER. She has received no medications in the ER so far.  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 She has had several surgeries for this. She has had an LSO and only her right ovary and fallopian tube remain. She had a right ovarian hemorrhagic cyst after presenting for follow up at [facility] with Dr. Brion Aliment, who is a gynecologic oncologist.   This is a very desired pregnancy for her. However, today her hCG level is at 736 (down from two days ago). She has a suspicious mass next to her right ovary. However, her pain is at a low score now and she would prefer to avoid surgery.   Past Medical History:  Diagnosis Date   Allergic rhinitis    Arthritis    Bipolar 1 disorder ([place])    Depression    GERD (gastroesophageal reflux disease)    Migraine    Obese    Ovarian cancer ([place])    PCOS (polycystic ovarian syndrome)    Vaginal delivery 12/12/2004    Past Surgical History:  Procedure Laterality Date   ANTERIOR CRUCIATE LIGAMENT REPAIR  2009   CERVICAL CONIZATION W/BX  01/31/2012    Procedure: CONIZATION CERVIX WITH BIOPSY;  Surgeon: Jonnie Kind, MD;  Location: [facility];  Service: Gynecology;  Laterality: N/A;  Cold Knife Conization of Cervix    Gynecologic History: Patient's last menstrual period was 12/20/2021 (exact date).  Obstetric History: G1P0  Family History  Problem Relation Age of Onset   Arthritis Other        both parents & grandparents   Heart disease Other        grandparents   Hyperlipidemia Other        grandparents   Diabetes Paternal Aunt    Hypertension Maternal Grandmother    Anesthesia problems Neg Hx    Hypotension Neg Hx    Malignant hyperthermia Neg Hx    Pseudochol deficiency Neg Hx     Social History   Socioeconomic History   Marital status: Single    Spouse name: Not on file   Number of children: Not on file   Years of education: Not on file   Highest education level: Not on file  Occupational History   Not on file  Tobacco Use   Smoking status: Every Day    Packs/day: 1.00    Years: 16.00    Pack years: 16.00    Types: Cigarettes   Smokeless tobacco: Never  Vaping Use   Vaping Use: Some  days   Substances: Nicotine, Flavoring  Substance and Sexual Activity   Alcohol use: Yes    Comment: occasional   Drug use: Yes    Frequency: 7.0 times per week    Types: Marijuana   Sexual activity: Yes    Birth control/protection: None, Condom  Other Topics Concern   Not on file  Social History Narrative   Single, lives with her son (born)   Unemployed, completed 9th grade   Social alcohol, ongoing tobacco   Social Determinants of Health   Financial Resource Strain: Not on file  Food Insecurity: Not on file  Transportation Needs: Not on file  Physical Activity: Not on file  Stress: Not on file  Social Connections: Not on file  Intimate Partner Violence: Not At Risk   Fear of Current or Ex-Partner: No   Emotionally Abused: No   Physically Abused: No   Sexually Abused: No    Allergies  Allergen Reactions    Blue Dyes (Parenteral) Anaphylaxis and Itching    Throat swelling and itching     Prior to Admission medications   Medication Sig Start Date End Date Taking? Authorizing Provider  busPIRone (BUSPAR) 10 MG tablet Take 1 tablet (10 mg total) by mouth once daily. 10/05/21   Clapacs, Madie Reno, MD  diphenhydramine-acetaminophen (TYLENOL PM) 25-500 MG TABS tablet Take 2 tablets by mouth at bedtime as needed (sleep/pain).    [provider]  docusate sodium (COLACE) 250 MG capsule Take 1 capsule (250 mg total) by mouth daily. 07/14/18   Duffy Bruce, MD  guanFACINE (TENEX) 1 MG tablet Take 1 tablet (1 mg total) by mouth once daily. 10/05/21   Clapacs, Madie Reno, MD  lamoTRIgine (LAMICTAL) 25 MG tablet Take 1 tablet (25 mg total) by mouth once daily. 10/05/21 10/05/22  Clapacs, Madie Reno, MD  metroNIDAZOLE (FLAGYL) 500 MG tablet Take 1 tablet (500 mg total) by mouth 2 (two) times daily. 07/01/18   Glyn Ade, PA-C  ondansetron (ZOFRAN ODT) 4 MG disintegrating tablet Take 1 tablet (4 mg total) by mouth every 8 (eight) hours as needed for nausea or vomiting. 07/14/18   Duffy Bruce, MD  oxyCODONE-acetaminophen (PERCOCET/ROXICET) 5-325 MG tablet Take 1-2 tablets by mouth every 6 (six) hours as needed for moderate pain or severe pain. 07/14/18   Duffy Bruce, MD  sertraline (ZOLOFT) 50 MG tablet Take (1/2) tablet (25 mg total) by mouth once daily. 10/05/21 10/05/22  Clapacs, Madie Reno, MD    Review of Systems  Constitutional: Negative.   HENT: Negative.    Eyes: Negative.   Respiratory: Negative.    Cardiovascular: Negative.   Gastrointestinal:  Positive for abdominal pain (now mild to absent). Negative for blood in stool, constipation, diarrhea, heartburn, melena, nausea and vomiting.  Genitourinary: Negative.        Mild vaginal bleeding  Musculoskeletal: Negative.   Skin: Negative.   Neurological: Negative.   Psychiatric/Behavioral: Negative.      Physical Exam BP 132/74 (BP Location:  Left Arm)    Pulse 60    Temp 98.8 F (37.1 C) (Oral)    Resp 18    LMP 12/20/2021 (Exact Date)    SpO2 98%  Patient's last menstrual period was 12/20/2021 (exact date). Physical Exam Constitutional:      General: She is not in acute distress.    Appearance: Normal appearance. She is well-developed.  HENT:     Head: Normocephalic and atraumatic.  Eyes:     General: No scleral icterus.  Conjunctiva/sclera: Conjunctivae normal.  Cardiovascular:     Rate and Rhythm: Normal rate and regular rhythm.     Heart sounds: No murmur heard.   No friction rub. No gallop.  Pulmonary:     Effort: Pulmonary effort is normal. No respiratory distress.     Breath sounds: Normal breath sounds. No wheezing or rales.  Abdominal:     General: Bowel sounds are normal. There is no distension.     Palpations: Abdomen is soft. There is no mass.     Tenderness: There is no abdominal tenderness. There is no guarding or rebound.     Comments: Midline incisional scar that extends above the umbilicus  Musculoskeletal:        General: Normal range of motion.     Cervical back: Normal range of motion and neck supple.  Neurological:     General: No focal deficit present.     Mental Status: She is alert and oriented to person, place, and time.     Cranial Nerves: No cranial nerve deficit.  Skin:    General: Skin is warm and dry.     Findings: No erythema.  Psychiatric:        Mood and Affect: Mood normal.        Behavior: Behavior normal.        Judgment: Judgment normal.    Female chaperone present for pelvic and breast  portions of the physical exam  Assessment: 36 y.o. G1P0 female here for  Pregnancy of unknown anatomic location   Plan: After a long discussion with the patient, she appears stable and has had no pain since I entered the room and spoke with her for about 25 minutes. Her abdomen is soft and non-surgical. She has no free fluid on ultrasound. She understands that this is a failed pregnancy  and asks for non-surgical management since she has only one ovary and fallopian tube. This is a reasonable request given the circumstances and overall medical picture. She agrees to follow up with the lab checks to ensure she is responding to the medication.  She understands that the risk of choosing this route is that her pregnancy could progress and she could need surgery in an emergency fashion. This would not be ideal given her extensive surgical history.    Will need methotrexate at 50mg /m^ = 2.3*50 = 115 mg IM Follow up hCG in 3 days, then repeat in 6 days (today is day 1, 3/4 is day 4, 3/7 is day 7).  Patient to avoid folic acid or folate Gave precautions to return (severe abdominal pain that does not remit) May return to hospital on 3/4 and 3/7 for lab draw.   Prentice Docker, MD 02/09/2022 6:14 PM

## 2022-02-09 NOTE — ED Notes (Signed)
Specials at bedside with the patient for medication administration.  ?

## 2022-02-09 NOTE — Progress Notes (Signed)
Orders for follow up methotrexate to be collected at outpatient labs at [facility] on 3/4 (hCG) and 3/7 (hCG, CBC, CMP) ?

## 2022-02-09 NOTE — Discharge Instructions (Addendum)
She might need to register at the ER desk, but is not an ER patient. The location of the lab is along the hall in the medical mall right across from the cardiopulmonary desk. There used to be a phone there they would pick up (on that desk) and it would ring into the lab.  ? ?Follow-up for your hCG testing on the 4th and 7th.  ? ?Take oxycodone as prescribed. Do not drink alcohol, drive or participate in any other potentially dangerous activities while taking this medication as it may make you sleepy. Do not take this medication with any other sedating medications, either prescription or over-the-counter. If you were prescribed Percocet or Vicodin, do not take these with acetaminophen (Tylenol) as it is already contained within these medications. ? ?This medication is an opiate (or narcotic) pain medication and can be habit forming. Use it as little as possible to achieve adequate pain control. Do not use or use it with extreme caution if you have a history of opiate abuse or dependence. If you are on a pain contract with your primary care doctor or a pain specialist, be sure to let them know you were prescribed this medication today from the Emergency Department. This medication is intended for your use only - do not give any to anyone else and keep it in a secure place where nobody else, especially children, have access to it. ? ? ?

## 2022-02-09 NOTE — ED Triage Notes (Signed)
Pt states she is [redacted] weeks pregnant and was seen here on  Monday for the same, ectopic pregnancy was not ruled out, pt is c/o worsening abd cramping with bleeding today and was told to come to the ED and not her appt at [facility] today at 430p ?

## 2022-02-09 NOTE — ED Provider Notes (Addendum)
? ?[facility] ?Provider Note ? ? ? Event Date/Time  ? First MD Initiated Contact with Patient 02/09/22 1031   ?  (approximate) ? ? ?History  ? ?Vaginal Bleeding ? ? ?HPI ? ?Jacqueline Morrow is a 36 y.o. female with history of left ovarian and fallopian tube removal in 2016 secondary to cancer who comes in with concerns for vaginal bleed in the setting of pregnancy.  Patient is approximately 7 to [redacted] weeks pregnant.  On review of records she was seen here on 2/27 when she had had sex and had some vaginal spotting.  She has a history of 2 prior miscarriages and 1 live birth.  At that time her hCG was 862.  OB/GYN was consulted and the plan was to get a repeat beta-hCG this afternoon but given she developed some recurrence of vaginal bleeding and some more worsening abdominal pain on the right side she was told to come to the ER to be further evaluated. ? ? ?Physical Exam  ? ?Triage Vital Signs: ?Last menstrual period 12/20/2021. ? ? ?Most recent vital signs: ?Vitals:  ? 02/09/22 1046  ?BP: 107/73  ?Pulse: 63  ?Resp: 17  ?SpO2: 100%  ? ? ? ?General: Awake, no distress.  ?CV:  Good peripheral perfusion.  ?Resp:  Normal effort.  ?Abd:  No distention. Tender lower abdomen on R  ?Other:  Mild vaginal spotting on pad  ? ? ?ED Results / Procedures / Treatments  ? ?Labs ?(all labs ordered are listed, but only abnormal results are displayed) ?Labs Reviewed  ?CBC  ?HCG, QUANTITATIVE, PREGNANCY  ? ? ? ?RADIOLOGY ?I have reviewed the  [place] and no IUP but R adnexal mass? Waiting on Rads read  ? ? ?PROCEDURES: ? ?Critical Care performed: No ? ?Procedures ? ? ?MEDICATIONS ORDERED IN ED: ?Medications - No data to display ? ? ?IMPRESSION / MDM / ASSESSMENT AND PLAN / ED COURSE  ?I reviewed the triage vital signs and the nursing notes. ?             ?               ? ?Differential diagnosis includes, but is not limited to, early pregnancy, miscarriage, ectopic.  Given the high concern for ectopic given prior  ultrasound we will get repeat hCG and repeat ultrasound. We will get repeat CBC to make sure no anemia and repeat hCG to evaluate pregnancy level.  Patient's ABO was positive does not need RhoGAM. Considered appendicits but sseems less likely given mass on FT and vaginal bleeding.  Discussed with pt return precautions in regards to this however.  ? ?I did review the note from the [place] from today on 3/1 where they advised patient to come to the ER due to worsening heavy bleeding and cramping. ? ?Hcg down trending ?Cbc no anemia  slight white count elevation stable from 2 days ago.  ? ?Will d/w with OB to decide next time.  ? ?D/w Dr. Glennon Mac who will come down and see patient. He is concerned about ectopic. Recommend methotrexate after personally seeing patient.  Discuss with charge nurse Opal Sidles on how this is given. She stated the protocol is for patient to go to same day surgery.  ? ?Will admit pt to same day surgery for methotrexate and discharge from OB.  ? ?Now informed  by charge that nurse from same day surgery should be able to give medication from here and dc from ER instead.  ? ? ? ? ?  FINAL CLINICAL IMPRESSION(S) / ED DIAGNOSES  ? ?Final diagnoses:  ?Vaginal bleeding affecting early pregnancy  ?Right tubal pregnancy without intrauterine pregnancy  ? ? ? ?Rx / DC Orders  ? ?ED Discharge Orders   ? ? None  ? ?  ? ? ? ?Note:  This document was prepared using Dragon voice recognition software and may include unintentional dictation errors. ?  ?Vanessa [place], MD ?02/09/22 1329 ? ?  ?Vanessa [place], MD ?02/09/22 1347 ? ?

## 2022-02-12 ENCOUNTER — Other Ambulatory Visit
Admission: RE | Admit: 2022-02-12 | Discharge: 2022-02-12 | Disposition: A | Discharge status: Home / Self Care | Payer: Medicaid Other | Attending: Obstetrics and Gynecology | Admitting: Obstetrics and Gynecology

## 2022-02-12 DIAGNOSIS — O3680X0 Pregnancy with inconclusive fetal viability, not applicable or unspecified: Secondary | ICD-10-CM | POA: Diagnosis not present

## 2022-02-12 LAB — HCG, QUANTITATIVE, PREGNANCY: hCG, Beta Chain, Quant, S: 393 m[IU]/mL — ABNORMAL HIGH (ref <5)

## 2022-02-18 ENCOUNTER — Other Ambulatory Visit
Admission: RE | Admit: 2022-02-18 | Discharge: 2022-02-18 | Disposition: A | Discharge status: Home / Self Care | Payer: Medicaid Other | Source: Ambulatory Visit | Attending: Obstetrics and Gynecology | Admitting: Obstetrics and Gynecology

## 2022-02-18 DIAGNOSIS — O3680X0 Pregnancy with inconclusive fetal viability, not applicable or unspecified: Secondary | ICD-10-CM

## 2022-02-18 LAB — COMPREHENSIVE METABOLIC PANEL
ALT: 11 U/L (ref 0–44)
AST: 11 U/L — ABNORMAL LOW (ref 15–41)
Albumin: 3.7 g/dL (ref 3.5–5.0)
Alkaline Phosphatase: 44 U/L (ref 38–126)
Anion gap: 6 (ref 5–15)
BUN: 8 mg/dL (ref 6–20)
CO2: 26 mmol/L (ref 22–32)
Calcium: 9.1 mg/dL (ref 8.9–10.3)
Chloride: 108 mmol/L (ref 98–111)
Creatinine, Ser: 0.75 mg/dL (ref 0.44–1.00)
GFR, Estimated: >60 mL/min (ref >60)
Glucose, Bld: 84 mg/dL (ref 70–99)
Potassium: 3.9 mmol/L (ref 3.5–5.1)
Sodium: 140 mmol/L (ref 135–145)
Total Bilirubin: 0.3 mg/dL (ref 0.3–1.2)
Total Protein: 7.5 g/dL (ref 6.5–8.1)

## 2022-02-18 LAB — CBC WITH DIFFERENTIAL/PLATELET
Abs Immature Granulocytes: 0.03 10*3/uL (ref 0.00–0.07)
Basophils Absolute: 0 10*3/uL (ref 0.0–0.1)
Basophils Relative: 0 %
Eosinophils Absolute: 0.3 10*3/uL (ref 0.0–0.5)
Eosinophils Relative: 3 %
HCT: 40.2 % (ref 36.0–46.0)
Hemoglobin: 13.2 g/dL (ref 12.0–15.0)
Immature Granulocytes: 0 %
Lymphocytes Relative: 23 %
Lymphs Abs: 2.3 10*3/uL (ref 0.7–4.0)
MCH: 30.9 pg (ref 26.0–34.0)
MCHC: 32.8 g/dL (ref 30.0–36.0)
MCV: 94.1 fL (ref 80.0–100.0)
Monocytes Absolute: 0.8 10*3/uL (ref 0.1–1.0)
Monocytes Relative: 8 %
Neutro Abs: 6.4 10*3/uL (ref 1.7–7.7)
Neutrophils Relative %: 66 %
Platelets: 241 10*3/uL (ref 150–400)
RBC: 4.27 MIL/uL (ref 3.87–5.11)
RDW: 13.1 % (ref 11.5–15.5)
WBC: 9.9 10*3/uL (ref 4.0–10.5)
nRBC: 0 % (ref 0.0–0.2)

## 2022-02-18 LAB — HCG, QUANTITATIVE, PREGNANCY: hCG, Beta Chain, Quant, S: 172 m[IU]/mL — ABNORMAL HIGH (ref <5)

## 2022-02-25 ENCOUNTER — Other Ambulatory Visit
Admission: RE | Admit: 2022-02-25 | Discharge: 2022-02-25 | Disposition: A | Discharge status: Home / Self Care | Payer: Medicaid Other | Source: Ambulatory Visit | Attending: Obstetrics and Gynecology | Admitting: Obstetrics and Gynecology

## 2022-02-25 ENCOUNTER — Other Ambulatory Visit: Payer: Self-pay

## 2022-02-25 DIAGNOSIS — O3680X0 Pregnancy with inconclusive fetal viability, not applicable or unspecified: Secondary | ICD-10-CM | POA: Insufficient documentation

## 2022-02-25 DIAGNOSIS — Z3A00 Weeks of gestation of pregnancy not specified: Secondary | ICD-10-CM | POA: Insufficient documentation

## 2022-02-25 LAB — HCG, QUANTITATIVE, PREGNANCY: hCG, Beta Chain, Quant, S: 38 m[IU]/mL — ABNORMAL HIGH (ref <5)

## 2022-04-09 ENCOUNTER — Emergency Department: Payer: Medicaid Other

## 2022-04-09 ENCOUNTER — Emergency Department
Admission: EM | Admit: 2022-04-09 | Discharge: 2022-04-09 | Disposition: A | Discharge status: Home / Self Care | Payer: Medicaid Other | Attending: Emergency Medicine | Admitting: Emergency Medicine

## 2022-04-09 ENCOUNTER — Other Ambulatory Visit: Payer: Self-pay

## 2022-04-09 ENCOUNTER — Encounter: Payer: Self-pay | Admitting: Emergency Medicine

## 2022-04-09 DIAGNOSIS — X501XXA Overexertion from prolonged static or awkward postures, initial encounter: Secondary | ICD-10-CM | POA: Insufficient documentation

## 2022-04-09 DIAGNOSIS — S39012A Strain of muscle, fascia and tendon of lower back, initial encounter: Secondary | ICD-10-CM | POA: Insufficient documentation

## 2022-04-09 DIAGNOSIS — M545 Low back pain, unspecified: Secondary | ICD-10-CM | POA: Diagnosis present

## 2022-04-09 LAB — POC URINE PREG, ED: Preg Test, Ur: NEGATIVE

## 2022-04-09 MED ORDER — METHOCARBAMOL 500 MG PO TABS
1000.0000 mg | ORAL_TABLET | Freq: Once | ORAL | Status: AC
  Administered 2022-04-09: 1000 mg via ORAL
  Filled 2022-04-09: qty 2

## 2022-04-09 MED ORDER — METHOCARBAMOL 500 MG PO TABS: 500.0000 mg | ORAL_TABLET | Freq: Four times a day (QID) | ORAL | 2 refills | Status: DC

## 2022-04-09 MED ORDER — MELOXICAM 7.5 MG PO TABS
15.0000 mg | ORAL_TABLET | Freq: Once | ORAL | Status: AC
  Administered 2022-04-09: 15 mg via ORAL
  Filled 2022-04-09: qty 2

## 2022-04-09 MED ORDER — MELOXICAM 15 MG PO TABS: 15.0000 mg | ORAL_TABLET | Freq: Every day | ORAL | 3 refills | Status: AC

## 2022-04-09 NOTE — ED Triage Notes (Signed)
Pt via POV from home. Pt c/o mid and lower back pain states that she bent down on Monday and she heard something pop. States since she has back pain. Pt has hx of sciatica and DDD. Pt is A&OX4 and NAD.  ?

## 2022-04-09 NOTE — ED Provider Notes (Signed)
? ?[facility] ?Provider Note ? ?Patient Contact: 4:15 PM (approximate) ? ? ?History  ? ?Back Pain ? ? ?HPI ? ?Jacqueline Morrow is a 36 y.o. female who presents the emergency department with a history of chronic back pain, sciatica, degenerative disc disease.  Patient had a CT scan several months ago with diffuse degeneration, bulging disc in her thoracic and lumbar spine.  She states that a few days ago she bent over, felt a popping sensation in her lower back and since has had increased back pain.  No increased radicular symptoms, no bowel or bladder dysfunction, saddle anesthesia or paresthesias.  No urinary or GI complaints.  Patient has been taking Tylenol with no relief of her symptoms. ?  ? ? ?Physical Exam  ? ?Triage Vital Signs: ?ED Triage Vitals  ?Enc Vitals Group  ?   BP 04/09/22 1528 132/89  ?   Pulse Rate 04/09/22 1528 79  ?   Resp 04/09/22 1528 20  ?   Temp 04/09/22 1528 97.8 ?F (36.6 ?C)  ?   Temp Source 04/09/22 1528 Oral  ?   SpO2 04/09/22 1528 98 %  ?   Weight 04/09/22 1527 225 lb (102.1 kg)  ?   Height 04/09/22 1527 '5\' 9"'$  (1.753 m)  ?   Head Circumference --   ?   Peak Flow --   ?   Pain Score 04/09/22 1527 7  ?   Pain Loc --   ?   Pain Edu? --   ?   Excl. in [place]? --   ? ? ?Most recent vital signs: ?Vitals:  ? 04/09/22 1528  ?BP: 132/89  ?Pulse: 79  ?Resp: 20  ?Temp: 97.8 ?F (36.6 ?C)  ?SpO2: 98%  ? ? ? ?General: Alert and in no acute distress.  ?Neck: No stridor. No cervical spine tenderness to palpation.  ?Cardiovascular:  Good peripheral perfusion ?Respiratory: Normal respiratory effort without tachypnea or retractions. Lungs CTAB. Good air entry to the bases with no decreased or absent breath sounds. ?Gastrointestinal: Bowel sounds ?4 quadrants. Soft and nontender to palpation. No guarding or rigidity. No palpable masses. No distention. No CVA tenderness. ?Musculoskeletal: Full range of motion to all extremities.  Visualization thoracic and lumbar spine reveals no visible  signs of trauma.  Patient does have diffuse thoracic and lumbar tenderness to palpation without palpable abnormality or step-off.  No tenderness over the SI joints or sciatic notch.  Patient is ambulatory but does have a slight limp.  She states that ambulation increases her pain. ?Neurologic:  No gross focal neurologic deficits are appreciated.  ?Skin:   No rash noted ?Other: ? ? ?ED Results / Procedures / Treatments  ? ?Labs ?(all labs ordered are listed, but only abnormal results are displayed) ?Labs Reviewed  ?POC URINE PREG, ED  ? ? ? ?EKG ? ? ? ? ?RADIOLOGY ? ?I personally viewed and evaluated these images as part of my medical decision making, as well as reviewing the written report by the radiologist. ? ?ED Provider Interpretation: No acute traumatic findings to the thoracic or lumbar spine.  Compared with previous imaging from December with CT. ? ?DG Thoracic Spine 2 View ? ?Result Date: 04/09/2022 ?CLINICAL DATA:  36 year old female with acute mid back pain following bending 5 days ago. Initial encounter. EXAM: THORACIC SPINE 2 VIEWS COMPARISON:  11/15/2021 CT FINDINGS: No acute fracture or subluxation identified. Minimal degenerative disc disease in the LOWER thoracic spine again noted. No focal bony lesions are  present. Mild apex RIGHT thoracic curvature again noted. IMPRESSION: No evidence of acute abnormality. Electronically Signed   By: Margarette Canada M.D.   On: 04/09/2022 17:03  ? ?DG Lumbar Spine 2-3 Views ? ?Result Date: 04/09/2022 ?CLINICAL DATA:  Acute low back pain following bending injury 5 days ago. Initial encounter. EXAM: LUMBAR SPINE - 3 VIEW COMPARISON:  11/15/2021 CT FINDINGS: Five non rib-bearing lumbar type vertebra are again identified in normal alignment. There is no evidence of fracture or subluxation. Disc spaces are maintained. No focal bony lesions are present. IMPRESSION: Negative. Electronically Signed   By: Margarette Canada M.D.   On: 04/09/2022 17:05   ? ?PROCEDURES: ? ?Critical Care  performed: No ? ?Procedures ? ? ?MEDICATIONS ORDERED IN ED: ?Medications  ?meloxicam (MOBIC) tablet 15 mg (15 mg Oral Given 04/09/22 1753)  ?methocarbamol (ROBAXIN) tablet 1,000 mg (1,000 mg Oral Given 04/09/22 1753)  ? ? ? ?IMPRESSION / MDM / ASSESSMENT AND PLAN / ED COURSE  ?I reviewed the triage vital signs and the nursing notes. ?             ?               ? ?Differential diagnosis includes, but is not limited to, compression fracture, lumbar strain, cauda equina, herniated disc ? ? ?Patient's diagnosis is consistent with lumbar strain.  Patient presents emergency department back pain after twisting wrong earlier this week.  Increased pain.  History of degenerative disc disease and sciatica.  Overall exam is reassuring with no neurodeficits.  Imaging was reassuring..  At this time patient be treated symptomatically with anti-inflammatory muscle relaxer.  Follow-up with neurosurgery given her chronic back issues.  Patient is given ED precautions to return to the ED for any worsening or new symptoms. ? ? ? ?  ? ? ?FINAL CLINICAL IMPRESSION(S) / ED DIAGNOSES  ? ?Final diagnoses:  ?Strain of lumbar region, initial encounter  ? ? ? ?Rx / DC Orders  ? ?ED Discharge Orders   ? ?      Ordered  ?  meloxicam (MOBIC) 15 MG tablet  Daily       ? 04/09/22 1747  ?  methocarbamol (ROBAXIN) 500 MG tablet  4 times daily       ? 04/09/22 1747  ? ?  ?  ? ?  ? ? ? ?Note:  This document was prepared using Dragon voice recognition software and may include unintentional dictation errors. ?  ?Darletta Moll, PA-C ?04/09/22 1758 ? ?  ?Naaman Plummer, MD ?04/09/22 1905 ? ?

## 2022-09-20 IMAGING — CR DG LUMBAR SPINE 2-3V
3 series · 3 of 3 positions shown · non-contrast
Comparison: 11/15/2021 CT

CLINICAL DATA: Acute low back pain following bending injury 5 days
ago. Initial encounter.

EXAM:
LUMBAR SPINE - 3 VIEW

[l-spine ap]
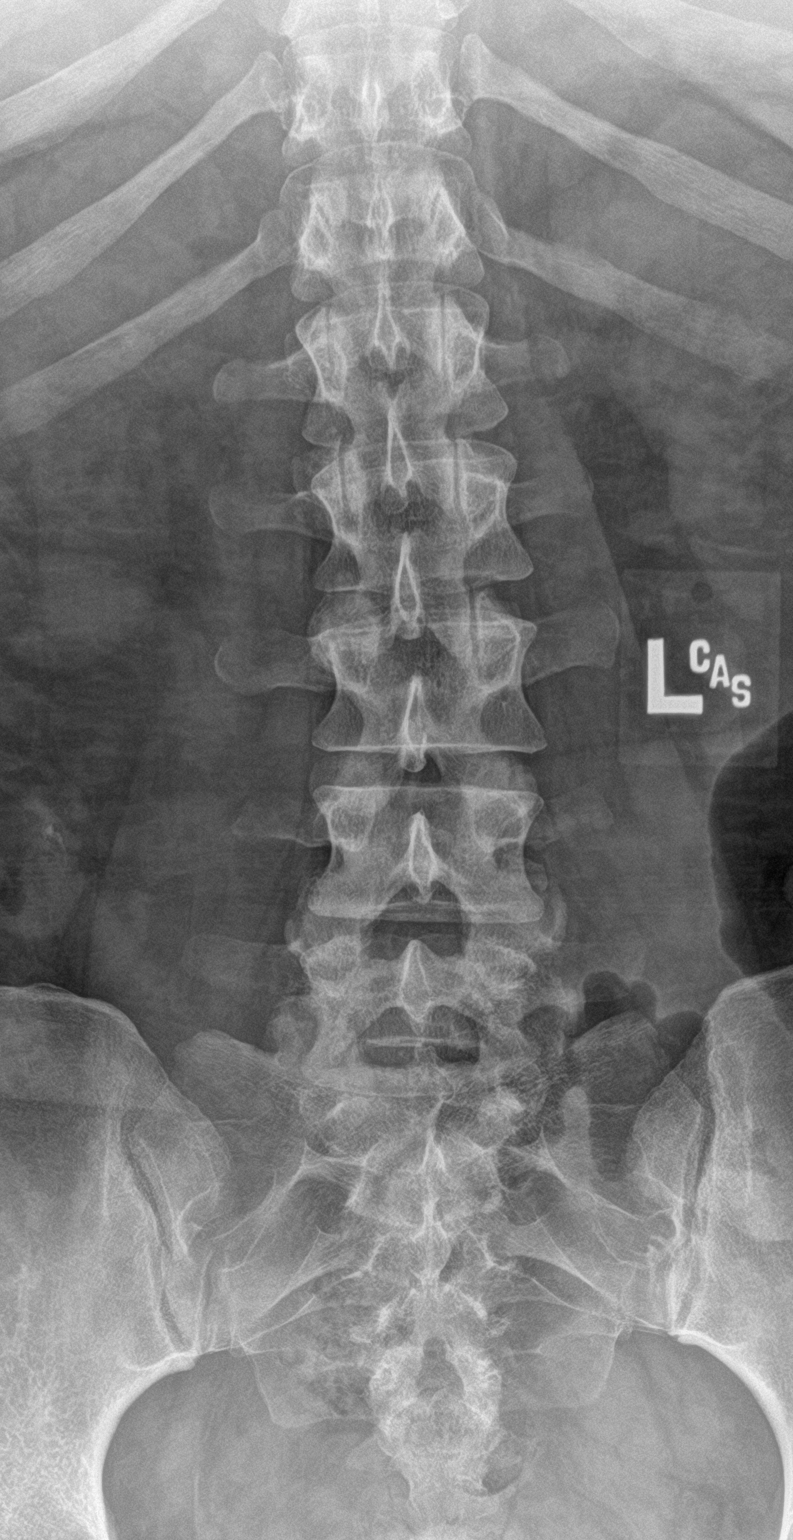

[l-spine lat]
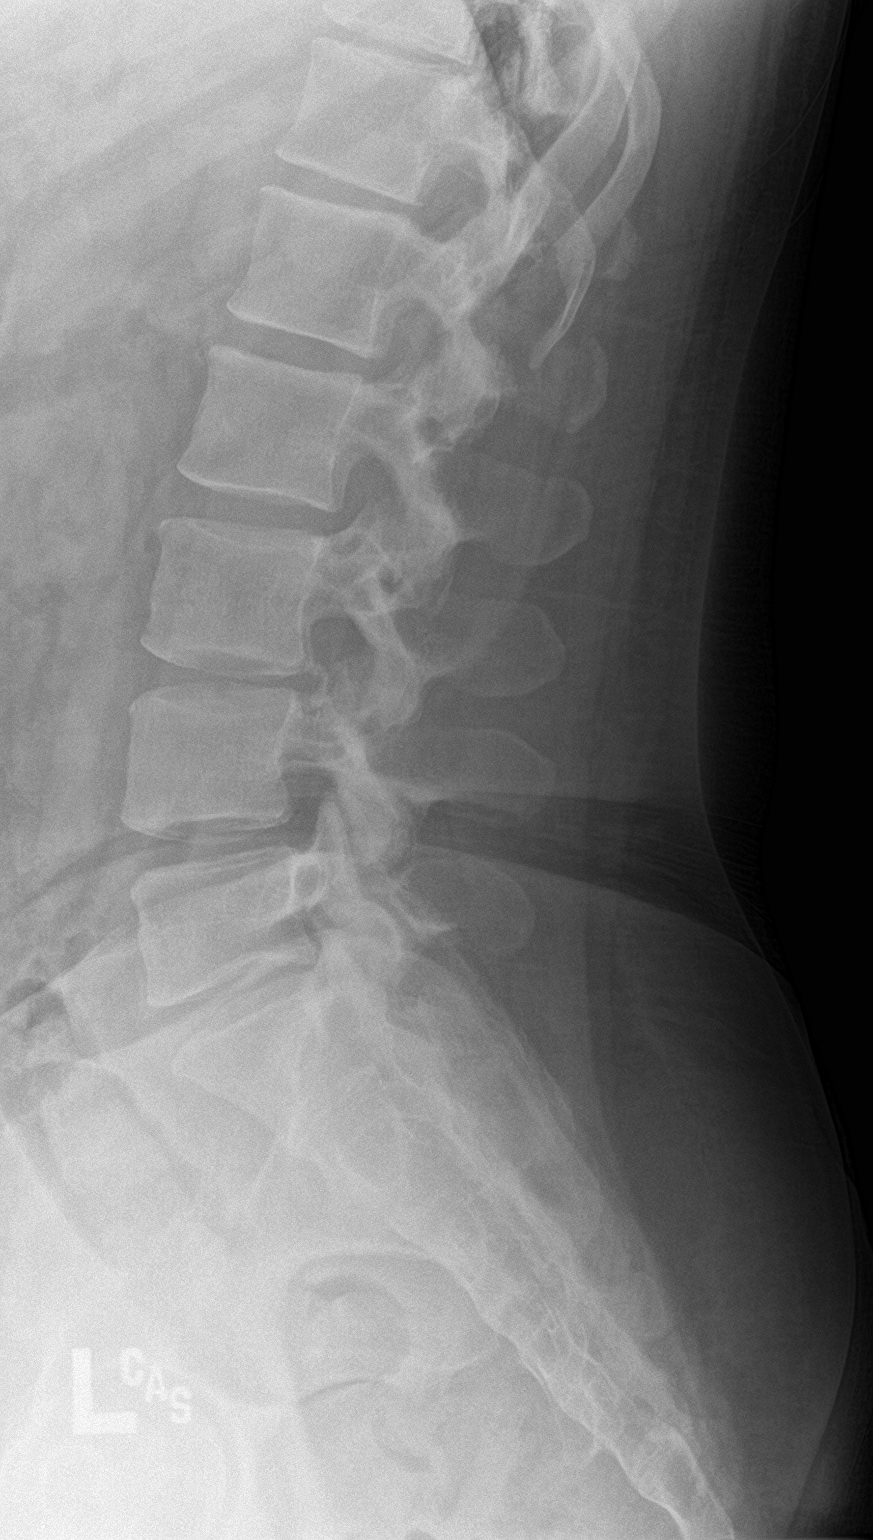

[l-spine spot]
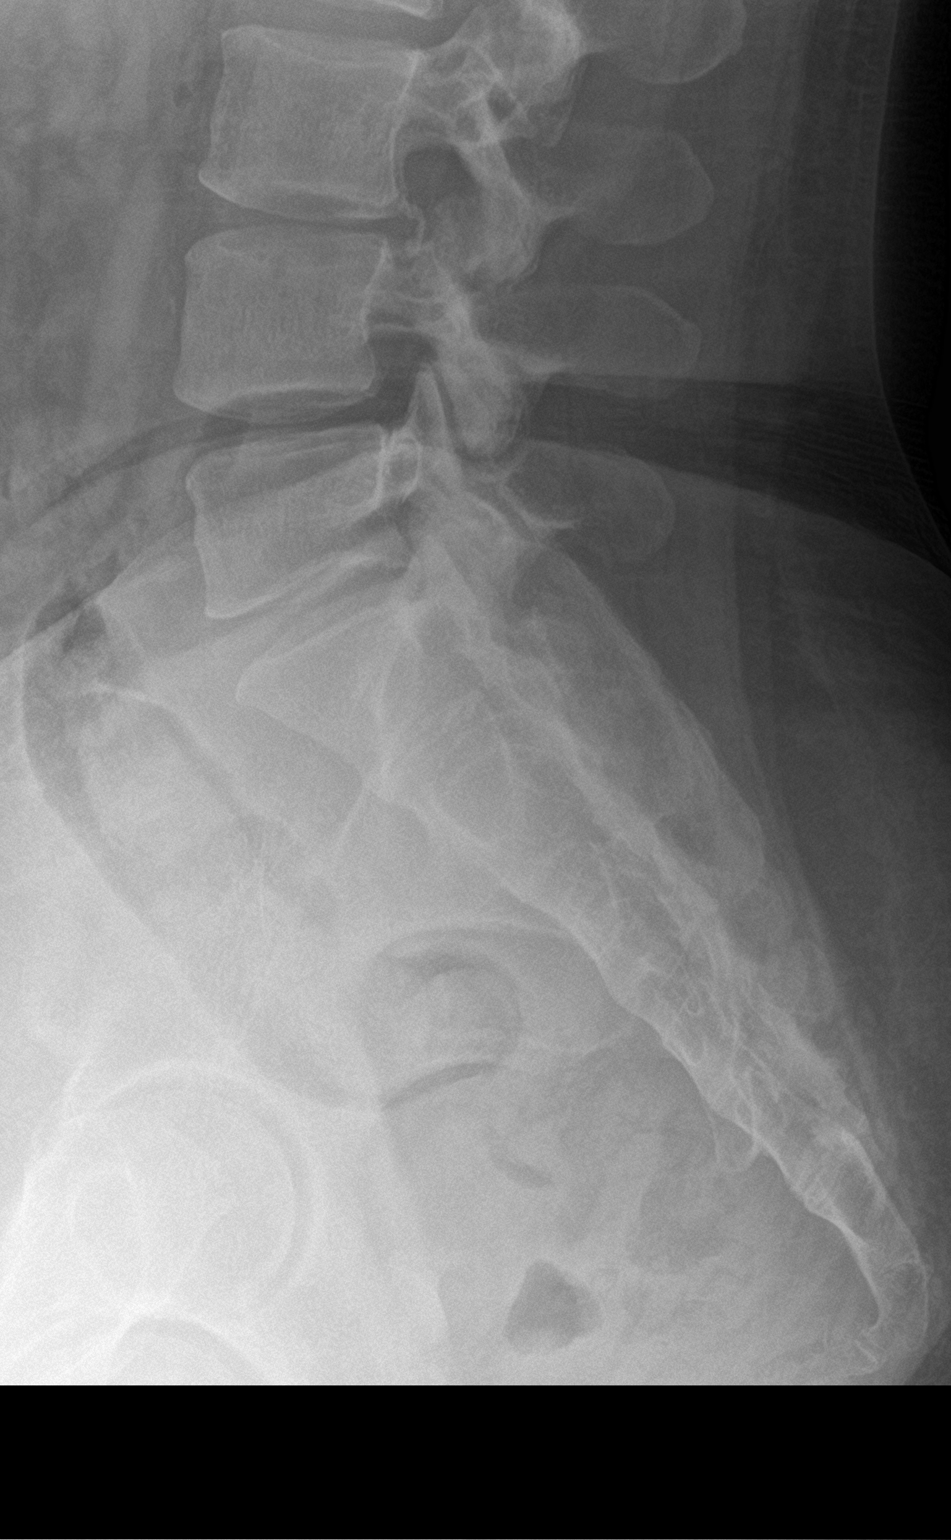

[3 of 3 positions shown; findings below may reference images not displayed]

FINDINGS: Five non rib-bearing lumbar type vertebra are again identified in
normal alignment.

There is no evidence of fracture or subluxation.

Disc spaces are maintained.

No focal bony lesions are present.
IMPRESSION: Negative.

## 2022-10-30 ENCOUNTER — Emergency Department
Admission: EM | Admit: 2022-10-30 | Discharge: 2022-10-30 | Disposition: A | Discharge status: Home / Self Care | Payer: Medicaid Other | Attending: Emergency Medicine | Admitting: Emergency Medicine

## 2022-10-30 DIAGNOSIS — R197 Diarrhea, unspecified: Secondary | ICD-10-CM | POA: Diagnosis not present

## 2022-10-30 DIAGNOSIS — K625 Hemorrhage of anus and rectum: Secondary | ICD-10-CM

## 2022-10-30 DIAGNOSIS — Z8543 Personal history of malignant neoplasm of ovary: Secondary | ICD-10-CM | POA: Insufficient documentation

## 2022-10-30 LAB — COMPREHENSIVE METABOLIC PANEL
ALT: 13 U/L (ref 0–44)
AST: 15 U/L (ref 15–41)
Albumin: 4 g/dL (ref 3.5–5.0)
Alkaline Phosphatase: 58 U/L (ref 38–126)
Anion gap: 8 (ref 5–15)
BUN: 8 mg/dL (ref 6–20)
CO2: 23 mmol/L (ref 22–32)
Calcium: 9.2 mg/dL (ref 8.9–10.3)
Chloride: 109 mmol/L (ref 98–111)
Creatinine, Ser: 0.74 mg/dL (ref 0.44–1.00)
GFR, Estimated: >60 mL/min (ref >60)
Glucose, Bld: 96 mg/dL (ref 70–99)
Potassium: 4.4 mmol/L (ref 3.5–5.1)
Sodium: 140 mmol/L (ref 135–145)
Total Bilirubin: 0.4 mg/dL (ref 0.3–1.2)
Total Protein: 8.2 g/dL — ABNORMAL HIGH (ref 6.5–8.1)

## 2022-10-30 LAB — CBC
HCT: 45.3 % (ref 36.0–46.0)
Hemoglobin: 15 g/dL (ref 12.0–15.0)
MCH: 30 pg (ref 26.0–34.0)
MCHC: 33.1 g/dL (ref 30.0–36.0)
MCV: 90.6 fL (ref 80.0–100.0)
Platelets: 283 10*3/uL (ref 150–400)
RBC: 5 MIL/uL (ref 3.87–5.11)
RDW: 13 % (ref 11.5–15.5)
WBC: 12.6 10*3/uL — ABNORMAL HIGH (ref 4.0–10.5)
nRBC: 0 % (ref 0.0–0.2)

## 2022-10-30 NOTE — ED Provider Notes (Signed)
[facility] Provider Note    Event Date/Time   First MD Initiated Contact with Patient 10/30/22 [phone]     (approximate)   History   Rectal Bleeding   HPI  Jacqueline Morrow is a 36 y.o. female with past medical history of bipolar disorder GERD obesity and ovarian cancer status post surgery who presents because of rectal bleeding.  Patient was feeling constipated yesterday.  Then she took some Pepto-Bismol and had multiple episodes of loose stool overnight.  The last 3 episodes she has had some bleeding.  Describes bright red blood that is in the toilet but not mixed in with the stool.  She is having some bilateral abdominal cramping that feels like she has to have a bowel movement and improves after she has a bowel movement.  No history of rectal bleeding does think she has a history of a hemorrhoid.  Denies any rectal pain denies fevers or chills or urinary symptoms.     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 --      Excl. in [place]? --     Most recent vital signs: Vitals:   10/30/22 0711  BP: 139/88  Pulse: 63  Resp: 18  Temp: 99 F (37.2 C)  SpO2: 100%     General: Awake, no distress.  CV:  Good peripheral perfusion.  Resp:  Normal effort.  Abd:  No distention.  Lower vertical midline surgical scar, abdomen is soft nontender throughout Neuro:             Awake, Alert, Oriented x 3  Other:  On the rectal vault on rectal exam no gross blood no melena No external hemorrhoids or anal fissures   ED Results / Procedures / Treatments  Labs (all labs ordered are listed, but only abnormal results are displayed) Labs Reviewed  COMPREHENSIVE METABOLIC PANEL - Abnormal; Notable for the following components:      Result Value   Total Protein 8.2 (*)    All other components within normal limits  CBC - Abnormal; Notable for  the following components:   WBC 12.6 (*)    All other components within normal limits  URINALYSIS, ROUTINE W REFLEX MICROSCOPIC  POC URINE PREG, ED     EKG     RADIOLOGY    PROCEDURES:  Critical Care performed: No  Procedures  The patient is on the cardiac monitor to evaluate for evidence of arrhythmia and/or significant heart rate changes.   MEDICATIONS ORDERED IN ED: Medications - No data to display   IMPRESSION / MDM / [place] / ED COURSE  I reviewed the triage vital signs and the nursing notes.                              Patient's presentation is most consistent with acute complicated illness / injury requiring diagnostic workup.  Differential diagnosis includes, but is not limited to, anal fissure, hemorrhoidal bleeding from internal or external hemorrhoid, rectal polyp, colitis, diverticular hemorrhage, AVM, malignancy  The patient is a 36 year old female who presents with hematochezia.  She started with constipation yesterday and then developed loose stool and had  multiple episodes of loose stool overnight.  She then has had about 3 episodes of bright red blood per rectum.  She shows me a picture and I see that there is blood in the bottom of the toilet not a large volume and it is not mixed in with the stool.  She does have some abdominal cramping no fevers or chills she has not has a history of similar.  Her abdominal exam is benign her rectal exam has no gross blood no hemorrhoids.  Patient's hemoglobin is stable she has a mild leukocytosis to 12, CMP is reassuring.  My suspicion for hemodynamically significant lower GI bleed such as diverticular hemorrhage or AVM is low and I suspect possible internal hemorrhoid versus rectal polyp.  I did discuss GI follow-up if bleeding persist.  We discussed return to ED for large-volume bleeding or any other new constitutional symptoms.  Given stable hemoglobin with no blood on exam I think she is appropriate for discharge.       FINAL CLINICAL IMPRESSION(S) / ED DIAGNOSES   Final diagnoses:  Rectal bleeding     Rx / DC Orders   ED Discharge Orders     None        Note:  This document was prepared using Dragon voice recognition software and may include unintentional dictation errors.   Rada Hay, MD 10/30/22 [phone]

## 2022-10-30 NOTE — ED Triage Notes (Signed)
Pt presents to the ED via POV due to rectal bleeding that started this morning. Pt states she is having some abdominal pain. Pt c/o feeling constipated. Pt denies V and pain 3/10. Pt has hx ovarian cx and hemorrhoids. Pt A&Ox3

## 2022-10-30 NOTE — Discharge Instructions (Addendum)
If you are feeling constipated please take MiraLAX.  I suspect that your rectal bleeding may be due to an internal hemorrhoid.  If you start to have larger volume of bleeding or passing clots or feeling lightheaded or dizzy or develop worsening abdominal pain or fever please return to the emergency department.  Otherwise please follow-up with gastroenterology.

## 2024-04-09 DIAGNOSIS — D3912 Neoplasm of uncertain behavior of left ovary: Secondary | ICD-10-CM | POA: Insufficient documentation

## 2024-04-17 ENCOUNTER — Other Ambulatory Visit: Payer: MEDICAID

## 2024-04-17 ENCOUNTER — Ambulatory Visit: Payer: MEDICAID

## 2024-04-24 ENCOUNTER — Inpatient Hospital Stay: Payer: MEDICAID | Attending: Obstetrics and Gynecology | Admitting: Obstetrics and Gynecology

## 2024-04-24 ENCOUNTER — Encounter: Payer: Self-pay | Admitting: Obstetrics and Gynecology

## 2024-04-24 ENCOUNTER — Other Ambulatory Visit: Payer: Self-pay

## 2024-04-24 ENCOUNTER — Inpatient Hospital Stay: Payer: MEDICAID

## 2024-04-24 VITALS — BP 126/84 | HR 64 | Temp 97.3°F | Resp 19 | Wt 279.6 lb

## 2024-04-24 DIAGNOSIS — F17210 Nicotine dependence, cigarettes, uncomplicated: Secondary | ICD-10-CM | POA: Insufficient documentation

## 2024-04-24 DIAGNOSIS — Z79899 Other long term (current) drug therapy: Secondary | ICD-10-CM | POA: Diagnosis not present

## 2024-04-24 DIAGNOSIS — F319 Bipolar disorder, unspecified: Secondary | ICD-10-CM | POA: Diagnosis not present

## 2024-04-24 DIAGNOSIS — D3910 Neoplasm of uncertain behavior of unspecified ovary: Secondary | ICD-10-CM

## 2024-04-24 DIAGNOSIS — K219 Gastro-esophageal reflux disease without esophagitis: Secondary | ICD-10-CM | POA: Diagnosis not present

## 2024-04-24 DIAGNOSIS — N6323 Unspecified lump in the left breast, lower outer quadrant: Secondary | ICD-10-CM

## 2024-04-24 DIAGNOSIS — E282 Polycystic ovarian syndrome: Secondary | ICD-10-CM | POA: Insufficient documentation

## 2024-04-24 DIAGNOSIS — M1990 Unspecified osteoarthritis, unspecified site: Secondary | ICD-10-CM | POA: Diagnosis not present

## 2024-04-24 DIAGNOSIS — Z139 Encounter for screening, unspecified: Secondary | ICD-10-CM

## 2024-04-24 DIAGNOSIS — Z1231 Encounter for screening mammogram for malignant neoplasm of breast: Secondary | ICD-10-CM

## 2024-04-24 DIAGNOSIS — F1290 Cannabis use, unspecified, uncomplicated: Secondary | ICD-10-CM | POA: Diagnosis not present

## 2024-04-24 DIAGNOSIS — F17290 Nicotine dependence, other tobacco product, uncomplicated: Secondary | ICD-10-CM | POA: Diagnosis not present

## 2024-04-24 DIAGNOSIS — Z8543 Personal history of malignant neoplasm of ovary: Secondary | ICD-10-CM | POA: Diagnosis not present

## 2024-04-24 NOTE — Progress Notes (Signed)
 Gynecologic Oncology Consult Visit   Referring Provider: Dr Madelene Schanz  Chief Concern: History of recurrent granulosa cell tumor  Subjective:  Jacqueline Morrow is a 38 y.o. G4P1 female who is seen in consultation from Dr. Madelene Schanz for granulosa cell tumor.   Granulosa cell carcinoma of left ovary  06/23/2015 Surgery  She underwent laparascopic LSO on June 23, 2015 with Dr. Amelie Baize in [place] for large granulosa cell tumor. Tumor board recommendation was surveillance with inhibins and ultrasound. She was lost to follow up from 2017 to 2019.  12/2017 Imaging  In 2019, she presented to the ED with abdominal pain and CT revealed 5.3 cm right adnexal lesion. Differential included cystic neoplasm, hydrosalpinx, hemorrhagic cyst, or pelvic inflammatory process. She did not have any follow up.   06/12/2020 Tumor Markers  Patient's tumor was tested for the following markers: Inhibin A/B. Results of the tumor marker test revealed Inhibin A 10, Inhibin B 525.  09/28/2020 Imaging  She presented to OSH ED and had a CT scan on 10/18 that showed a right ovarian mass concerning for granulosa cell tumor and a left adnexal mass.  10/23/2020 Tumor Markers  Patient's tumor was tested for the following markers: Inhibin A/B. Results of the tumor marker test revealed Inhibin A 5.6, Inhibin B 146.  11/17/2020 Surgery  XLAP, right ovarian cystectomy, excision of left adnexal and eipiploic cysts, omentectomy. 1. Normal upper abdomen. Adhesions of omentum to anterior abdominal wall at previous incision. Omentum grossly normal. 2. Grossly normal uterus, 8 week size. 3. 3cm cystic lesion arising from the left uterine cornua, but surgically absent left ovary and fallopian tube. Frozen section, concern for possible recurrent granulosa cell tumor. 4. 2cm cystic lesions x 2 arrising from the sigmoid colon epiploica.  5. 2cm hemorrhagic lesion arising from right ovary. Frozen section consistent with  hemorrhagic corpus luteum. 6. No there peritoneal disease   FINAL DIAGNOSIS   A. OMENTUM, PARTIAL OMENTECTOMY: BENIGN FIBROADIPOSE TISSUES WITH NO EVIDENCE OF MALIGNANCY.   B. SOFT TISSUE, EPIPLOIC APPENDAGE, EXCISION: METASTATIC GRANULOSA CELL TUMOR, FAVOR JUVENILE TYPE. (See comment.)  C. SOFT TISSUE, "LEFT ADNEXA", EXCISION: RECURRENT GRANULOSA CELL TUMOR, FAVOR JUVENILE TYPE. (See comment.)  D. SOFT TISSUE, EPIPLOIC APPENDAGE, EXCISION: METASTATIC GRANULOSA CELL TUMOR, FAVOR JUVENILE TYPE. (See comment.)  E. OVARY, RIGHT "CYST", EXCISION: HEMORRHAGIC CORPUS LUTEAL CYST. NO EVIDENCE OF MALIGNANCY.   01/01/21 Dr Valeri Gate post op We discussed treatment options moving forward at length today. We discussed that standard of care would be completion staging with right salpingo oophorectomy as well as hysterectomy followed by consideration of adjuvant therapy with likely with platinum-based chemotherapy. She and her partner are very interested in future fertility and would like to try for 1 more child. We discussed alternative options moving forward. First would be completion of adjuvant platinum-based chemotherapy without completion surgery and then attempting pregnancy. Second would be surveillance while trying for another pregnancy followed by completion staging following delivery. Follow-up discussion the patient and her partner would like to proceed with surveillance. We did discuss that while our pathologists think that this is a juvenile granulosa cell tumor on their pathology review that her treatment course thus far is not consistent with an aggressive juvenile type granulosa cell tumor given that her original diagnosis was in 2016. Egg retrieval and IVF in the future unfortunately not an option given cost.   Seen by Dr Osborn Blaze 04/09/24  Inhibin B 30.5, but still premenopausal. She has oligomenorrhea.  History of PCOS, and desires possible  future fertility. One child who is now 39.   Estradiol , Testosterone normal and DHEAS low.   She reports no current pain but experiences mild bloating and tender breasts without bleeding for the past week or two. Her menstrual cycles became regular after her last surgery but have been increasingly spaced out over the last four months, with her last period in January. She was treated for an ectopic pregnancy in 2023, which was managed with MTX, and her hormone levels returned to zero after follow-up.  She has been diagnosed with polycystic ovary syndrome (PCOS) through ultrasound and blood work, with symptoms including abnormal hair growth on her chin, nipples, and lower abdomen, and elevated testosterone levels. No abnormal vaginal discharge but reports external itchiness around the clitoral area, which her fianc has also experienced after intercourse. Despite a recurrence of the tumor, she has not yet been seen by GYN oncology and has not received treatment for the recurrence.  She has a history of polycystic ovary syndrome (PCOS) with oligomenorrhea and has been actively trying to conceive with her fianc for the past six months. No history of pelvic infections, although she has had trichomonas in the past. Her obstetric history includes two early spontaneous abortions, one ectopic pregnancy, and one live birth. She does not recall having difficulty conceiving in the past, although she was not actively trying.  An ultrasound performed today did not show any pelvic masses.   PAP 04/09/24 normal. 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  History of cryotherapy for abnormal PAP 2006 per her history, [facility] biopsy 2013.  Past Medical History: has a past medical history of Anemia, Arthritis, Mild dysplasia of cervix,   Past Surgical History: has a past surgical history that includes Arthroscopic repair ACL (2014);   Family History: family history includes Colon cancer in her paternal grandmother; Diabetes in her maternal grandmother; Lung cancer in her mother;  Melanoma in her paternal grandfather. Social History: reports that she has been smoking cigarettes. She has never used smokeless tobacco. She reports that she does not currently use alcohol. She reports current drug use. Drug: Marijuana.    Problem List: Patient Active Problem List   Diagnosis Date Noted   Pregnancy of unknown anatomic location 02/09/2022   Bipolar 1 disorder (HCC)    Moderate dysplasia of cervix (CIN II) 01/31/2012   Cholelithiasis without obstruction 09/15/2011   Nausea 09/08/2011   Smoker 09/08/2011   Overweight    Back pain, chronic     Past Medical History: Past Medical History:  Diagnosis Date   Allergic rhinitis    Arthritis    Bipolar 1 disorder (HCC)    Depression    GERD (gastroesophageal reflux disease)    Migraine    Obese    Ovarian cancer (HCC)    PCOS (polycystic ovarian syndrome)    Vaginal delivery 12/12/2004    Past Surgical History: Past Surgical History:  Procedure Laterality Date   ANTERIOR CRUCIATE LIGAMENT REPAIR  2009   CERVICAL CONIZATION W/BX  01/31/2012   Procedure: CONIZATION CERVIX WITH BIOPSY;  Surgeon: Albino Hum, MD;  Location: [facility];  Service: Gynecology;  Laterality: N/A;  Cold Knife Conization of Cervix      OB History:  OB History  Gravida Para Term Preterm AB Living  1       SAB IAB Ectopic Multiple Live Births          # Outcome Date GA Lbr Len/2nd Weight Sex Type Anes PTL Lv  1 [place]  Family History: Family History  Problem Relation Age of Onset   Arthritis Other        both parents & grandparents   Heart disease Other        grandparents   Hyperlipidemia Other        grandparents   Diabetes Paternal Aunt    Hypertension Maternal Grandmother    Anesthesia problems Neg Hx    Hypotension Neg Hx    Malignant hyperthermia Neg Hx    Pseudochol deficiency Neg Hx     Social History: Social History   Socioeconomic History   Marital status: Single    Spouse name: Not on file    Number of children: Not on file   Years of education: Not on file   Highest education level: Not on file  Occupational History   Not on file  Tobacco Use   Smoking status: Every Day    Current packs/day: 1.00    Average packs/day: 1 pack/day for 16.0 years (16.0 ttl pk-yrs)    Types: Cigarettes   Smokeless tobacco: Never  Vaping Use   Vaping status: Some Days   Substances: Nicotine , Flavoring  Substance and Sexual Activity   Alcohol use: Yes    Comment: occasional   Drug use: Yes    Frequency: 7.0 times per week    Types: Marijuana   Sexual activity: Yes    Birth control/protection: None, Condom  Other Topics Concern   Not on file  Social History Narrative   Single, lives with her son (born)   Unemployed, completed 9th grade   Social alcohol, ongoing tobacco   Social Drivers of Corporate investment banker Strain: Not on file  Food Insecurity: Not on file  Transportation Needs: Not on file  Physical Activity: Not on file  Stress: Not on file  Social Connections: Not on file  Intimate Partner Violence: Not At Risk (01/27/2022)   Humiliation, Afraid, Rape, and Kick questionnaire    Fear of Current or Ex-Partner: No    Emotionally Abused: No    Physically Abused: No    Sexually Abused: No    Allergies: Allergies  Allergen Reactions   Blue Dyes (Parenteral) Anaphylaxis and Itching    Throat swelling and itching     Current Medications: Current Outpatient Medications  Medication Sig Dispense Refill   busPIRone  (BUSPAR ) 10 MG tablet Take 1 tablet (10 mg total) by mouth once daily. (Patient not taking: Reported on 02/09/2022) 30 tablet 1   guanFACINE  (TENEX ) 1 MG tablet Take 1 tablet (1 mg total) by mouth once daily. (Patient not taking: Reported on 02/09/2022) 30 tablet 1   lamoTRIgine  (LAMICTAL ) 25 MG tablet Take 1 tablet (25 mg total) by mouth once daily. (Patient not taking: Reported on 02/09/2022) 30 tablet 1   methocarbamol  (ROBAXIN ) 500 MG tablet Take 1 tablet (500  mg total) by mouth 4 (four) times daily. 16 tablet 2   sertraline  (ZOLOFT ) 50 MG tablet Take (1/2) tablet (25 mg total) by mouth once daily. (Patient not taking: Reported on 02/09/2022) 15 tablet 1   No current facility-administered medications for this visit.    Review of Systems General: negative for, fevers, chills, fatigue, changes in sleep, changes in weight or appetite some headaches Skin: negative for changes in color, texture, moles or lesions Eyes: negative for, changes in vision, pain, diplopia HEENT: negative for, change in hearing, pain, discharge, tinnitus, vertigo, voice changes, sore throat, neck masses Breasts: breast lump on left Pulmonary: some cough Cardiac:  negative for, palpitations, syncope, pain, discomfort, pressure Gastrointestinal: some N/V Genitourinary/Sexual: negative for, dysuria, discharge, hesitancy, nocturia, retention, stones, infections, STD's, incontinence Musculoskeletal: negative for, pain, stiffness, swelling, range of motion limitation Hematology: negative for, easy bruising, bleeding Neurologic/Psych: negative for, headaches, seizures, paralysis, weakness, tremor, change in gait, change in sensation, mood swings, depression, anxiety, change in memory  Objective:  Physical Examination:  BP 126/84   Pulse 64   Temp (!) 97.3 F (36.3 C)   Resp 19   Wt 279 lb 9.6 oz (126.8 kg)   SpO2 98%   BMI 41.29 kg/m    ECOG Performance Status: 0 - Asymptomatic  General appearance: alert, cooperative, and appears stated age HEENT:PERRLA. Missing teeth Lymph node survey: non-palpable, axillary, inguinal, supraclavicular Cardiovascular: regular rate and rhythm, no murmurs or gallops Respiratory: no rales, rhonchi or wheezing Breasts: 1 cm smooth nodule in the left breast at 6 o'clock Abdomen: no hernias and well healed incision Back: inspection of back is normal Extremities: extremities normal, atraumatic, no cyanosis or edema Skin exam - normal  coloration and turgor, no rashes, no suspicious skin lesions noted. Neurological exam reveals alert, oriented, normal speech, no focal findings or movement disorder noted.  Pelvic: exam chaperoned by nurse;  Vulva: normal appearing vulva with no masses, tenderness or lesions; Vagina: normal vagina; Adnexa: normal adnexa in size, nontender and no masses; Uterus: uterus is normal size, shape, consistency and nontender; Cervix: anteverted; Rectal: not indicated     Assessment:  Jacqueline Morrow is a 38 y.o. P17 female diagnosed with granulosa cell tumor.   She underwent laparascopic LSO on June 23, 2015 with Dr. Amelie Baize in [place] for large granulosa cell tumor. Tumor board recommendation was surveillance with inhibins and ultrasound. She was lost to follow up from 2017 to 2019.  11/17/2020 Inhibin B 146, XLAP, right ovarian cystectomy benign, excision of left adnexal and sigmoid eipiploic cysts positive for recurrent GCT (favor juvenile type). No residual disease.  Recommended completion surgery and chemotherapy. Patient declined due to desire to preserve fertility.  04/09/24 seen by Dr Madelene Schanz.  Oligomenorrhea and possible PCOS. Inhibin B 30.5, but still premenopausal. Estradiol , Testosterone normal and DHEAS low.  PAP normal but HR HPV + (18, 45).  History of cryotherapy for abnormal PAP 2006 per history, [facility] biopsy 2013.  Benign feeling left breast nodule at 6 o'clock  Medical co-morbidities complicating care: PCOS, smoker.  Plan:   Problem List Items Addressed This Visit       Endocrine   Granulosa cell tumor of ovary - Primary   We discussed that I would recommend a CT scan to assess for recurrent GCT, as inhibin levels not reliable premenopausally.    Will get mammograms to evaluate probably benign left breast nodule.   She has history of cervical dysplasia and now HR HPV+.  Will consider colposcopy when she returns.  We will see her back in one month after imaging  available.  Hopefully, there will no evidence of recurrent GCT in which case she could consider conception.    The patient's diagnosis, an outline of the further diagnostic and laboratory studies which will be required, the recommendation, and alternatives were discussed.  All questions were answered to the patient's satisfaction.  A total of 60 minutes were spent with the patient/family today; 40% was spent in education, counseling and coordination of care for GCT.    Hermine Loots, MD  CC:  Schermerhorn, Joselyn Nicely, MD [address] [facility] [place],  [place] [address] [phone]

## 2024-04-25 ENCOUNTER — Inpatient Hospital Stay: Payer: MEDICAID

## 2024-04-25 NOTE — Progress Notes (Signed)
 [facility] Clinical Social Work  Clinical Social Work was referred by medical provider for assessment of psychosocial needs.  Clinical Social Worker contacted patient by phone to offer support and assess for needs.    Patient requested assistance with her utilities.  Unfortunately, patient is not in treatment and does not qualify for the [facility].  Securely emailed her local resources.     Kennth Peal, LCSW  Clinical Social Worker [facility]

## 2024-04-25 NOTE — Progress Notes (Signed)
 I spoke with patient and she would like resources for help with her utilities. Referral for Social worker entered!

## 2024-04-25 NOTE — Addendum Note (Signed)
 Addended by: Aimie Wagman A on: 04/25/2024 01:58 PM   Modules accepted: Orders

## 2024-04-26 ENCOUNTER — Encounter (INDEPENDENT_AMBULATORY_CARE_PROVIDER_SITE_OTHER): Payer: Self-pay

## 2024-04-30 ENCOUNTER — Ambulatory Visit: Payer: MEDICAID

## 2024-05-03 ENCOUNTER — Ambulatory Visit
Admission: RE | Admit: 2024-05-03 | Discharge: 2024-05-03 | Disposition: A | Discharge status: Home / Self Care | Payer: MEDICAID | Source: Ambulatory Visit | Attending: Obstetrics and Gynecology | Admitting: Obstetrics and Gynecology

## 2024-05-03 DIAGNOSIS — N6323 Unspecified lump in the left breast, lower outer quadrant: Secondary | ICD-10-CM

## 2024-05-09 ENCOUNTER — Ambulatory Visit
Admission: RE | Admit: 2024-05-09 | Discharge: 2024-05-09 | Disposition: A | Discharge status: Home / Self Care | Payer: MEDICAID | Source: Ambulatory Visit | Attending: Obstetrics and Gynecology | Admitting: Obstetrics and Gynecology

## 2024-05-09 DIAGNOSIS — D3910 Neoplasm of uncertain behavior of unspecified ovary: Secondary | ICD-10-CM | POA: Diagnosis present

## 2024-05-09 MED ORDER — IOHEXOL 300 MG/ML  SOLN
100.0000 mL | Freq: Once | INTRAMUSCULAR | Status: AC | PRN
  Administered 2024-05-09: 100 mL via INTRAVENOUS

## 2024-05-15 ENCOUNTER — Inpatient Hospital Stay: Payer: MEDICAID | Attending: Obstetrics and Gynecology | Admitting: Obstetrics and Gynecology

## 2024-05-15 ENCOUNTER — Other Ambulatory Visit: Payer: Self-pay

## 2024-05-15 VITALS — BP 117/74 | HR 75 | Resp 20 | Wt 280.5 lb

## 2024-05-15 DIAGNOSIS — D3910 Neoplasm of uncertain behavior of unspecified ovary: Secondary | ICD-10-CM

## 2024-05-15 DIAGNOSIS — F1290 Cannabis use, unspecified, uncomplicated: Secondary | ICD-10-CM | POA: Insufficient documentation

## 2024-05-15 DIAGNOSIS — Z800 Family history of malignant neoplasm of digestive organs: Secondary | ICD-10-CM | POA: Insufficient documentation

## 2024-05-15 DIAGNOSIS — R87619 Unspecified abnormal cytological findings in specimens from cervix uteri: Secondary | ICD-10-CM

## 2024-05-15 DIAGNOSIS — F17210 Nicotine dependence, cigarettes, uncomplicated: Secondary | ICD-10-CM | POA: Diagnosis not present

## 2024-05-15 DIAGNOSIS — Z801 Family history of malignant neoplasm of trachea, bronchus and lung: Secondary | ICD-10-CM | POA: Diagnosis not present

## 2024-05-15 DIAGNOSIS — D3912 Neoplasm of uncertain behavior of left ovary: Secondary | ICD-10-CM | POA: Insufficient documentation

## 2024-05-15 DIAGNOSIS — R1900 Intra-abdominal and pelvic swelling, mass and lump, unspecified site: Secondary | ICD-10-CM | POA: Insufficient documentation

## 2024-05-15 LAB — PREGNANCY, URINE: Preg Test, Ur: NEGATIVE

## 2024-05-15 NOTE — Progress Notes (Addendum)
 Gynecologic Oncology Consult Visit   Referring Provider: Dr Lovetta  Chief Concern: History of recurrent granulosa cell tumor  Subjective:  Jacqueline Morrow is a 38 y.o. G4P1 female who is seen in consultation from Dr. Lovetta for granulosa cell tumor.   Menses q38mo now and desires conception when possible.   CT scan 05/09/24 FINDINGS: CHEST: The visualized thyroid is normal other than a subcentimeter nodule in the right lobe. Thoracic aorta is normal in caliber. The main pulmonary artery is normal in caliber. The heart is normal in size. There is no free fluid or lymphadenopathy. The trachea and mainstem bronchi are patent. Subsegmental atelectatic changes are noted within the lung bases. The lungs are otherwise clear.   ABDOMEN/PELVIS: The liver appears normal. There is cholelithiasis. The spleen is normal. The pancreas is normal. The left adrenal is normal. There is slight interval increase in size of the right adrenal adenoma. The left kidney is normal. Right renal cyst is seen. The abdominal aorta is normal in caliber. Scattered atherosclerotic changes are present. The bladder is normal. The uterus appears normal. There is prominence of the right ovary measuring 5.0 cm.   The appendix is normal. Large and small bowel loops are otherwise within normal limits. No free fluid or adenopathy. There is a new peritoneal nodule anteriorly within the abdomen just above the umbilicus measuring 8 mm (series 2 image 81).   BONES: There are degenerative changes of the spine.   IMPRESSION: Nonspecific new subcentimeter peritoneal nodule, for which early peritoneal carcinomatosis is not excluded given the patient's history. Short-term follow-up recommended.   Nonspecific prominence of the right ovary.   Mammo 05/03/24 IMPRESSION: 1. There is a prominent fat lobule versus lipoma at the site of palpable concern in the LEFT breast. Any further workup of the patient's  symptoms should be based on the clinical assessment. 2. No mammographic evidence of malignancy in the LEFT breast. 3. There is incidental sonographic note of a probably benign RIGHT breast mass. Option for short-term follow-up versus definitive characterization with biopsy was discussed with patient. Patient would prefer to proceed with short-term follow-up. As such, recommend RIGHT breast ultrasound in 6 months. This will establish 6 months of definitive stability.  Breast US  05/03/24  IMPRESSION: 1. There is a prominent fat lobule versus lipoma at the site of palpable concern in the LEFT breast. Any further workup of the patient's symptoms should be based on the clinical assessment. 2. No mammographic evidence of malignancy in the LEFT breast. 3. There is incidental sonographic note of a probably benign RIGHT breast mass. Option for short-term follow-up versus definitive characterization with biopsy was discussed with patient. Patient would prefer to proceed with short-term follow-up. As such, recommend RIGHT breast ultrasound in 6 months. This will establish 6 months of definitive stability.   RECOMMENDATION: RIGHT breast ultrasound in 6 months.  Granulosa cell carcinoma of left ovary  06/23/2015 Surgery  She underwent laparascopic LSO on June 23, 2015 with Dr. Greig Shutter in [place] for large granulosa cell tumor. Tumor board recommendation was surveillance with inhibins and ultrasound. She was lost to follow up from 2017 to 2019.  12/2017 Imaging  In 2019, she presented to the ED with abdominal pain and CT revealed 5.3 cm right adnexal lesion. Differential included cystic neoplasm, hydrosalpinx, hemorrhagic cyst, or pelvic inflammatory process. She did not have any follow up.   06/12/2020 Tumor Markers  Patient's tumor was tested for the following markers: Inhibin A/B. Results of the tumor marker test  revealed Inhibin A 10, Inhibin B 525.  09/28/2020 Imaging  She presented  to OSH ED and had a CT scan on 10/18 that showed a right ovarian mass concerning for granulosa cell tumor and a left adnexal mass.  10/23/2020 Tumor Markers  Patient's tumor was tested for the following markers: Inhibin A/B. Results of the tumor marker test revealed Inhibin A 5.6, Inhibin B 146.  11/17/2020 Surgery  XLAP, right ovarian cystectomy, excision of left adnexal and eipiploic cysts, omentectomy. 1. Normal upper abdomen. Adhesions of omentum to anterior abdominal wall at previous incision. Omentum grossly normal. 2. Grossly normal uterus, 8 week size. 3. 3cm cystic lesion arising from the left uterine cornua, but surgically absent left ovary and fallopian tube. Frozen section, concern for possible recurrent granulosa cell tumor. 4. 2cm cystic lesions x 2 arrising from the sigmoid colon epiploica.  5. 2cm hemorrhagic lesion arising from right ovary. Frozen section consistent with hemorrhagic corpus luteum. 6. No there peritoneal disease   FINAL DIAGNOSIS   A. OMENTUM, PARTIAL OMENTECTOMY: BENIGN FIBROADIPOSE TISSUES WITH NO EVIDENCE OF MALIGNANCY.   B. SOFT TISSUE, EPIPLOIC APPENDAGE, EXCISION: METASTATIC GRANULOSA CELL TUMOR, FAVOR JUVENILE TYPE. (See comment.)  C. SOFT TISSUE, LEFT ADNEXA, EXCISION: RECURRENT GRANULOSA CELL TUMOR, FAVOR JUVENILE TYPE. (See comment.)  D. SOFT TISSUE, EPIPLOIC APPENDAGE, EXCISION: METASTATIC GRANULOSA CELL TUMOR, FAVOR JUVENILE TYPE. (See comment.)  E. OVARY, RIGHT CYST, EXCISION: HEMORRHAGIC CORPUS LUTEAL CYST. NO EVIDENCE OF MALIGNANCY.   01/01/21 Dr Sotero Iles post op We discussed treatment options moving forward at length today. We discussed that standard of care would be completion staging with right salpingo oophorectomy as well as hysterectomy followed by consideration of adjuvant therapy with likely with platinum-based chemotherapy. She and her partner are very interested in future fertility and would like to try for 1 more child. We  discussed alternative options moving forward. First would be completion of adjuvant platinum-based chemotherapy without completion surgery and then attempting pregnancy. Second would be surveillance while trying for another pregnancy followed by completion staging following delivery. Follow-up discussion the patient and her partner would like to proceed with surveillance. We did discuss that while our pathologists think that this is a juvenile granulosa cell tumor on their pathology review that her treatment course thus far is not consistent with an aggressive juvenile type granulosa cell tumor given that her original diagnosis was in 2016. Egg retrieval and IVF in the future unfortunately not an option given cost.   Seen by Dr Geralyn 04/09/24  Inhibin B 30.5, but still premenopausal. She has oligomenorrhea.  History of PCOS, and desires possible future fertility. One child who is now 67.  Estradiol , Testosterone normal and DHEAS low.   She reports no current pain but experiences mild bloating and tender breasts without bleeding for the past week or two. Her menstrual cycles became regular after her last surgery but have been increasingly spaced out over the last four months, with her last period in January. She was treated for an ectopic pregnancy in 2023, which was managed with MTX, and her hormone levels returned to zero after follow-up.  She has been diagnosed with polycystic ovary syndrome (PCOS) through ultrasound and blood work, with symptoms including abnormal hair growth on her chin, nipples, and lower abdomen, and elevated testosterone levels. No abnormal vaginal discharge but reports external itchiness around the clitoral area, which her fianc has also experienced after intercourse. Despite a recurrence of the tumor, she has not yet been seen by GYN oncology and  has not received treatment for the recurrence.  She has a history of polycystic ovary syndrome (PCOS) with oligomenorrhea and has  been actively trying to conceive with her fianc for the past six months. No history of pelvic infections, although she has had trichomonas in the past. Her obstetric history includes two early spontaneous abortions, one ectopic pregnancy, and one live birth. She does not recall having difficulty conceiving in the past, although she was not actively trying.  An ultrasound performed today did not show any pelvic masses.   PAP 04/09/24 normal. 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  History of cryotherapy for abnormal PAP 2006 per her history, [facility] biopsy 2013.  Past Medical History: has a past medical history of Anemia, Arthritis, Mild dysplasia of cervix,   Past Surgical History: has a past surgical history that includes Arthroscopic repair ACL (2014);   Family History: family history includes Colon cancer in her paternal grandmother; Diabetes in her maternal grandmother; Lung cancer in her mother; Melanoma in her paternal grandfather. Social History: reports that she has been smoking cigarettes. She has never used smokeless tobacco. She reports that she does not currently use alcohol. She reports current drug use. Drug: Marijuana.    Problem List: Patient Active Problem List   Diagnosis Date Noted   Granulosa cell tumor of ovary 04/24/2024   Pregnancy of unknown anatomic location 02/09/2022   Bipolar 1 disorder (HCC)    Moderate dysplasia of cervix (CIN II) 01/31/2012   Cholelithiasis without obstruction 09/15/2011   Nausea 09/08/2011   Smoker 09/08/2011   Overweight    Back pain, chronic     Past Medical History: Past Medical History:  Diagnosis Date   Allergic rhinitis    Arthritis    Bipolar 1 disorder (HCC)    Depression    GERD (gastroesophageal reflux disease)    Migraine    Obese    Ovarian cancer (HCC)    PCOS (polycystic ovarian syndrome)    Vaginal delivery 12/12/2004    Past Surgical History: Past Surgical History:  Procedure Laterality Date   ANTERIOR CRUCIATE LIGAMENT  REPAIR  2009   CERVICAL CONIZATION W/BX  01/31/2012   Procedure: CONIZATION CERVIX WITH BIOPSY;  Surgeon: Norleen LULLA Server, MD;  Location: [facility];  Service: Gynecology;  Laterality: N/A;  Cold Knife Conization of Cervix      OB History:  OB History  Gravida Para Term Preterm AB Living  1       SAB IAB Ectopic Multiple Live Births          # Outcome Date GA Lbr Len/2nd Weight Sex Type Anes PTL Lv  1 Gravida             Family History: Family History  Problem Relation Age of Onset   Arthritis Other        both parents & grandparents   Heart disease Other        grandparents   Hyperlipidemia Other        grandparents   Diabetes Paternal Aunt    Hypertension Maternal Grandmother    Anesthesia problems Neg Hx    Hypotension Neg Hx    Malignant hyperthermia Neg Hx    Pseudochol deficiency Neg Hx     Social History: Social History   Socioeconomic History   Marital status: Single    Spouse name: Not on file   Number of children: Not on file   Years of education: Not on file   Highest education level: Not  on file  Occupational History   Not on file  Tobacco Use   Smoking status: Every Day    Current packs/day: 1.00    Average packs/day: 1 pack/day for 16.0 years (16.0 ttl pk-yrs)    Types: Cigarettes   Smokeless tobacco: Never  Vaping Use   Vaping status: Some Days   Substances: Nicotine , Flavoring  Substance and Sexual Activity   Alcohol use: Yes    Comment: occasional   Drug use: Yes    Frequency: 7.0 times per week    Types: Marijuana   Sexual activity: Yes    Birth control/protection: None, Condom  Other Topics Concern   Not on file  Social History Narrative   Single, lives with her son (born)   Unemployed, completed 9th grade   Social alcohol, ongoing tobacco   Social Drivers of Corporate investment banker Strain: Not on file  Food Insecurity: Food Insecurity Present (04/24/2024)   Hunger Vital Sign    Worried About Running Out of Food in the Last  Year: Sometimes true    Ran Out of Food in the Last Year: Sometimes true  Transportation Needs: No Transportation Needs (04/24/2024)   PRAPARE - Administrator, Civil Service (Medical): No    Lack of Transportation (Non-Medical): No  Physical Activity: Not on file  Stress: Not on file  Social Connections: Not on file  Intimate Partner Violence: Not At Risk (04/24/2024)   Humiliation, Afraid, Rape, and Kick questionnaire    Fear of Current or Ex-Partner: No    Emotionally Abused: No    Physically Abused: No    Sexually Abused: No    Allergies: Allergies  Allergen Reactions   Blue Dyes (Parenteral) Anaphylaxis and Itching    Throat swelling and itching     Current Medications: Current Outpatient Medications  Medication Sig Dispense Refill   busPIRone  (BUSPAR ) 10 MG tablet Take 1 tablet (10 mg total) by mouth once daily. (Patient not taking: Reported on 02/09/2022) 30 tablet 1   guanFACINE  (TENEX ) 1 MG tablet Take 1 tablet (1 mg total) by mouth once daily. (Patient not taking: Reported on 02/09/2022) 30 tablet 1   lamoTRIgine  (LAMICTAL ) 25 MG tablet Take 1 tablet (25 mg total) by mouth once daily. (Patient not taking: Reported on 02/09/2022) 30 tablet 1   methocarbamol  (ROBAXIN ) 500 MG tablet Take 1 tablet (500 mg total) by mouth 4 (four) times daily. 16 tablet 2   sertraline  (ZOLOFT ) 50 MG tablet Take (1/2) tablet (25 mg total) by mouth once daily. (Patient not taking: Reported on 02/09/2022) 15 tablet 1   No current facility-administered medications for this visit.    Review of Systems General: negative for, fevers, chills, fatigue, changes in sleep, changes in weight or appetite some headaches Skin: negative for changes in color, texture, moles or lesions Eyes: negative for, changes in vision, pain, diplopia HEENT: negative for, change in hearing, pain, discharge, tinnitus, vertigo, voice changes, sore throat, neck masses Breasts: breast lump on left Pulmonary: some  cough Cardiac: negative for, palpitations, syncope, pain, discomfort, pressure Gastrointestinal: some N/V Genitourinary/Sexual: negative for, dysuria, discharge, hesitancy, nocturia, retention, stones, infections, STD's, incontinence Musculoskeletal: negative for, pain, stiffness, swelling, range of motion limitation Hematology: negative for, easy bruising, bleeding Neurologic/Psych: negative for, headaches, seizures, paralysis, weakness, tremor, change in gait, change in sensation, mood swings, depression, anxiety, change in memory  Objective:  Physical Examination:  LMP 04/19/2024    ECOG Performance Status: 0 - Asymptomatic  General appearance:  alert, cooperative, and appears stated age HEENT:PERRLA. Missing teeth Lymph node survey: non-palpable, axillary, inguinal, supraclavicular Cardiovascular: regular rate and rhythm, no murmurs or gallops Respiratory: no rales, rhonchi or wheezing Breasts: 1 cm smooth nodule in the left breast at 6 o'clock Abdomen: no hernias and well healed incision Back: inspection of back is normal Extremities: extremities normal, atraumatic, no cyanosis or edema Skin exam - normal coloration and turgor, no rashes, no suspicious skin lesions noted. Neurological exam reveals alert, oriented, normal speech, no focal findings or movement disorder noted.  Pelvic: exam chaperoned by nurse;  Vulva: normal appearing vulva with no masses, tenderness or lesions; Vagina: normal vagina; Adnexa: normal adnexa in size, nontender and no masses; Uterus: uterus is normal size, shape, consistency and nontender; Cervix: anteverted; Rectal: not indicated  Colposcopy note.  Informed consent done and time out as well. Pregnancy test negative Vagina soaked with acetic acid.  Transformation zone of cervix visible despite prior cryotherapy and cone biopsy.  No lesions seen in cervix or upper vagina. Patient tolerated the procedure well.   Assessment:  Jacqueline Morrow is a 38 y.o.  P37 female diagnosed with granulosa cell tumor. She underwent laparascopic LSO on June 23, 2015 with Dr. Greig Shutter in [place] for large granulosa cell tumor. Tumor board recommendation was surveillance with inhibins and ultrasound. She was lost to follow up from 2017 to 2019.  11/17/2020 Inhibin B 146, XLAP, right ovarian cystectomy benign, excision of left adnexal and sigmoid eipiploic cysts positive for recurrent GCT (favor juvenile type). No residual disease.  Recommended completion surgery and chemotherapy. Patient declined due to desire to preserve fertility.  04/09/24 seen by Dr Lovetta.  Oligomenorrhea and possible PCOS. Inhibin B 30.5, but still premenopausal. Estradiol , Testosterone normal and DHEAS low.   PAP normal but HR HPV + (18, 45).  History of cryotherapy for abnormal PAP 2006 per history, cone biopsy 2013.  Benign feeling left breast nodule at 6 o'clock, mammo/US  shows lipoma.  Probably benign right breast mass also noted on mammo and US .    Medical co-morbidities complicating care: PCOS, smoker.  Plan:   Problem List Items Addressed This Visit       Endocrine   Granulosa cell tumor of ovary - Primary   We discussed 8 mm anterior abdominal nodule is indeterminate. Inhibin levels not reliable premenopausally, but not concerning.  Will have her RTC in 3 months with CT scan and inhibin to assess stability of peritoneal nodule.  If reassuring she would like to get pregnant, but menses only q26months now.  So likely has infertility due to PCOS and oligo ovulation.    She has history of cervical dysplasia and now HR HPV+.  Colposcopy of cervix today normal.  Repeat screening in 1 year.    She will repeat breast US  in 6 months to follow up on probably benign mass.   The patient's diagnosis, an outline of the further diagnostic and laboratory studies which will be required, the recommendation, and alternatives were discussed.  All questions were answered to the patient's  satisfaction.  Prentice Agent, MD  CC:  Department, [facility] [address] [place],  [place] [id number] [phone]

## 2024-05-23 ENCOUNTER — Ambulatory Visit: Payer: MEDICAID

## 2024-06-11 ENCOUNTER — Ambulatory Visit: Payer: MEDICAID

## 2024-06-27 ENCOUNTER — Ambulatory Visit: Payer: MEDICAID

## 2024-08-15 ENCOUNTER — Ambulatory Visit
Admission: RE | Admit: 2024-08-15 | Discharge: 2024-08-15 | Disposition: A | Discharge status: Home / Self Care | Payer: MEDICAID | Source: Ambulatory Visit | Attending: Nurse Practitioner | Admitting: Nurse Practitioner

## 2024-08-15 DIAGNOSIS — D3910 Neoplasm of uncertain behavior of unspecified ovary: Secondary | ICD-10-CM | POA: Diagnosis present

## 2024-08-15 MED ORDER — IOHEXOL 300 MG/ML  SOLN
100.0000 mL | Freq: Once | INTRAMUSCULAR | Status: AC | PRN
  Administered 2024-08-15: 100 mL via INTRAVENOUS

## 2024-08-28 ENCOUNTER — Inpatient Hospital Stay: Payer: MEDICAID | Attending: Obstetrics and Gynecology | Admitting: Obstetrics and Gynecology

## 2024-08-28 ENCOUNTER — Inpatient Hospital Stay: Payer: MEDICAID

## 2024-08-28 VITALS — BP 133/76 | HR 70 | Temp 97.9°F | Resp 20 | Wt 287.4 lb

## 2024-08-28 DIAGNOSIS — F319 Bipolar disorder, unspecified: Secondary | ICD-10-CM | POA: Diagnosis not present

## 2024-08-28 DIAGNOSIS — Z800 Family history of malignant neoplasm of digestive organs: Secondary | ICD-10-CM | POA: Diagnosis not present

## 2024-08-28 DIAGNOSIS — D3910 Neoplasm of uncertain behavior of unspecified ovary: Secondary | ICD-10-CM

## 2024-08-28 DIAGNOSIS — Z8759 Personal history of other complications of pregnancy, childbirth and the puerperium: Secondary | ICD-10-CM | POA: Insufficient documentation

## 2024-08-28 DIAGNOSIS — Z90721 Acquired absence of ovaries, unilateral: Secondary | ICD-10-CM | POA: Diagnosis not present

## 2024-08-28 DIAGNOSIS — R935 Abnormal findings on diagnostic imaging of other abdominal regions, including retroperitoneum: Secondary | ICD-10-CM | POA: Diagnosis not present

## 2024-08-28 DIAGNOSIS — Z808 Family history of malignant neoplasm of other organs or systems: Secondary | ICD-10-CM | POA: Insufficient documentation

## 2024-08-28 DIAGNOSIS — Z79899 Other long term (current) drug therapy: Secondary | ICD-10-CM | POA: Insufficient documentation

## 2024-08-28 DIAGNOSIS — Z87410 Personal history of cervical dysplasia: Secondary | ICD-10-CM | POA: Diagnosis not present

## 2024-08-28 DIAGNOSIS — K219 Gastro-esophageal reflux disease without esophagitis: Secondary | ICD-10-CM | POA: Diagnosis not present

## 2024-08-28 DIAGNOSIS — E282 Polycystic ovarian syndrome: Secondary | ICD-10-CM | POA: Diagnosis not present

## 2024-08-28 DIAGNOSIS — Z801 Family history of malignant neoplasm of trachea, bronchus and lung: Secondary | ICD-10-CM | POA: Insufficient documentation

## 2024-08-28 DIAGNOSIS — N915 Oligomenorrhea, unspecified: Secondary | ICD-10-CM | POA: Insufficient documentation

## 2024-08-28 DIAGNOSIS — N6325 Unspecified lump in the left breast, overlapping quadrants: Secondary | ICD-10-CM | POA: Insufficient documentation

## 2024-08-28 DIAGNOSIS — F17210 Nicotine dependence, cigarettes, uncomplicated: Secondary | ICD-10-CM | POA: Insufficient documentation

## 2024-08-28 NOTE — Progress Notes (Signed)
 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 G4P1 female who is seen in consultation from Dr. Lovetta for granulosa cell tumor.   Menses q72mo and desires conception when possible. Started on Progestin 7 days a month by Dr Lovetta.  She has an 44 yo child, but recently got married and would like to have another child if this does not compromise her own health and prognosis from GCT.  CT scan 2 weeks ago shows growth of two small omental/peritoneal nodules since last scan 4 months ago (10 mm and 7 mm).  No new complaints except history of sciatica on the right and has some numbness in the lateral aspect of her right foot.   CT scan 08/18/24 FINDINGS: Lower chest: Bibasilar atelectasis/scarring with ground-glass opacities.   Hepatobiliary: Diffuse hepatic steatosis. No suspicious hepatic lesion. Cholelithiasis without findings of acute cholecystitis. No biliary ductal dilation.   Pancreas: No pancreatic ductal dilation or evidence of acute inflammation.   Spleen: No splenomegaly.   Adrenals/Urinary Tract: 19 mm right adrenal nodule slowly increasing in size from CT July 05, 2018 when it measured 15 mm possibly an adenoma although an indolent slow growing metastasis is not excluded. Left adrenal gland appears normal. No hydronephrosis. Kidneys demonstrate symmetric enhancement. Exophytic 2.1 cm right renal cyst. Urinary bladder is unremarkable for degree of distension.   Stomach/Bowel: Stomach is unremarkable for degree of distension. Radiopaque enteric contrast material traverses the ileocecal valve. No pathologic dilation of small or large bowel.   Vascular/Lymphatic: Normal caliber abdominal aorta. Smooth IVC contours. No pathologically enlarged abdominal or pelvic lymph nodes.   Reproductive: Uterus is unremarkable. Prominent right ovary again seen.  No suspicious left adnexal mass.   Other: Small anterior omental nodule/lymph node measures 10 mm on image 52/3 previously 8 mm. Tiny nodule along the left pericolic gutter measures 7 mm on image 30/3 previously 3 mm. No significant abdominopelvic free fluid.   Musculoskeletal: No aggressive lytic or blastic lesion of bone. Thoracolumbar spondylosis.   IMPRESSION: 1. Increased size of 2 small omental/peritoneal nodules, findings are nonspecific but suspicious for metastatic disease. No significant ascites. Consider further evaluation by nuclear medicine PET-CT. 2. 19 mm right adrenal nodule slowly increasing in size from CT July 05, 2018 when it measured 15 mm possibly an adenoma although an indolent slow growing metastasis while not favored is not excluded. Suggest attention on follow-up imaging. 3. Diffuse hepatic steatosis. 4. Cholelithiasis without findings of acute cholecystitis. 5. Bibasilar atelectasis/scarring with ground-glass opacities, which may reflect atelectasis or infection.   CT scan 05/09/24 FINDINGS: CHEST: The visualized thyroid is normal other than a subcentimeter nodule in the right lobe. Thoracic aorta is normal in caliber. The main pulmonary artery is normal in caliber. The heart is normal in size. There is no free fluid or lymphadenopathy. The trachea and mainstem bronchi are patent. Subsegmental atelectatic changes are noted within the lung bases. The lungs are otherwise clear.   ABDOMEN/PELVIS: The liver appears normal. There is cholelithiasis. The spleen is normal. The pancreas is normal. The left adrenal is normal. There is slight interval increase in size of the right adrenal adenoma. The left kidney is normal. Right renal cyst is seen. The abdominal aorta is normal in caliber. Scattered atherosclerotic changes are present. The bladder is normal. The uterus appears normal. There is prominence of the right ovary measuring 5.0 cm.   The appendix is  normal. Large and small bowel loops are otherwise within normal limits. No free fluid or adenopathy. There is a new peritoneal nodule anteriorly within the abdomen just above the umbilicus measuring 8 mm (series 2 image 81).   BONES: There are degenerative changes of the spine.   IMPRESSION: Nonspecific new subcentimeter peritoneal nodule, for which early peritoneal carcinomatosis is not excluded given the patient's history. Short-term follow-up recommended.   Nonspecific prominence of the right ovary.   Mammo 05/03/24 IMPRESSION: 1. There is a prominent fat lobule versus lipoma at the site of palpable concern in the LEFT breast. Any further workup of the patient's symptoms should be based on the clinical assessment. 2. No mammographic evidence of malignancy in the LEFT breast. 3. There is incidental sonographic note of a probably benign RIGHT breast mass. Option for short-term follow-up versus definitive characterization with biopsy was discussed with patient. Patient would prefer to proceed with short-term follow-up. As such, recommend RIGHT breast ultrasound in 6 months. This will establish 6 months of definitive stability.  Breast US  05/03/24  IMPRESSION: 1. There is a prominent fat lobule versus lipoma at the site of palpable concern in the LEFT breast. Any further workup of the patient's symptoms should be based on the clinical assessment. 2. No mammographic evidence of malignancy in the LEFT breast. 3. There is incidental sonographic note of a probably benign RIGHT breast mass. Option for short-term follow-up versus definitive characterization with biopsy was discussed with patient. Patient would prefer to proceed with short-term follow-up. As such, recommend RIGHT breast ultrasound in 6 months. This will establish 6 months of definitive stability.   RECOMMENDATION: RIGHT breast ultrasound in 6 months.  Granulosa cell carcinoma of left ovary  06/23/2015 Surgery   She underwent laparascopic LSO on June 23, 2015 with Dr. Greig Shutter in [place] for large granulosa cell tumor. Tumor board recommendation was surveillance with inhibins and ultrasound. She was lost to follow up from 2017 to 2019.  12/2017 Imaging  In 2019, she presented to the ED with abdominal pain and CT revealed 5.3 cm right adnexal lesion. Differential included cystic neoplasm, hydrosalpinx, hemorrhagic cyst, or pelvic inflammatory process. She did not have any follow up.   06/12/2020 Tumor Markers  Patient's tumor was tested for the following markers: Inhibin A/B. Results of the tumor marker test revealed Inhibin A 10, Inhibin B 525.  09/28/2020 Imaging  She presented to OSH ED and had a CT scan on 10/18 that showed a right ovarian mass concerning for granulosa cell tumor and a left adnexal mass.  10/23/2020 Tumor Markers  Patient's tumor was tested for the following markers: Inhibin A/B. Results of the tumor marker test revealed Inhibin A 5.6, Inhibin B 146.  11/17/2020 Surgery  XLAP, right ovarian cystectomy, excision of left adnexal and eipiploic cysts, omentectomy. 1. Normal upper abdomen. Adhesions of omentum to anterior abdominal wall at previous incision. Omentum grossly normal. 2. Grossly normal uterus, 8 week size. 3. 3cm cystic lesion arising from the left uterine cornua, but surgically absent left ovary and fallopian tube. Frozen section, concern for possible recurrent granulosa cell tumor. 4. 2cm cystic lesions x 2 arrising from the sigmoid colon epiploica.  5. 2cm hemorrhagic lesion arising from right ovary. Frozen section consistent with hemorrhagic corpus luteum. 6. No there peritoneal disease   FINAL DIAGNOSIS   A. OMENTUM, PARTIAL OMENTECTOMY: BENIGN FIBROADIPOSE TISSUES WITH NO EVIDENCE OF MALIGNANCY.   B. SOFT TISSUE, EPIPLOIC APPENDAGE, EXCISION: METASTATIC GRANULOSA CELL TUMOR, FAVOR JUVENILE TYPE. (See comment.)  C. SOFT TISSUE, LEFT ADNEXA,  EXCISION: RECURRENT GRANULOSA CELL TUMOR, FAVOR JUVENILE TYPE. (See comment.)  D. SOFT TISSUE, EPIPLOIC APPENDAGE, EXCISION: METASTATIC GRANULOSA CELL TUMOR, FAVOR JUVENILE TYPE. (See comment.)  E. OVARY, RIGHT CYST, EXCISION: HEMORRHAGIC CORPUS LUTEAL CYST. NO EVIDENCE OF MALIGNANCY.   01/01/21 Dr Sotero Iles post op Discussed that standard of care would be completion staging with right salpingo oophorectomy as well as hysterectomy followed by consideration of adjuvant therapy likely with platinum-based chemotherapy. She and her partner are very interested in future fertility and would like to try for 1 more child. Discussed alternative options moving forward. First would be completion of adjuvant platinum-based chemotherapy without completion surgery and then attempting pregnancy. Second would be surveillance while trying for another pregnancy followed by completion staging following delivery. Follow-up discussion the patient and her partner would like to proceed with surveillance. We did discuss that while our pathologists think that this is a juvenile granulosa cell tumor on their pathology review that her treatment course thus far is not consistent with an aggressive juvenile type granulosa cell tumor given that her original diagnosis was in 2016. Egg retrieval and IVF in the future unfortunately not an option given cost.   She was treated for an ectopic pregnancy in 2023, which was managed with MTX, and her hormone levels returned to zero after follow-up.  Seen by Dr Geralyn at [facility] 04/09/24  Inhibin B 30.5, but still premenopausal. She has oligomenorrhea.  History of PCOS, and desires possible future fertility. One child who is now 30.  Estradiol , Testosterone normal and DHEAS low. She has been diagnosed with polycystic ovary syndrome (PCOS) through ultrasound and blood work, with symptoms including abnormal hair growth on her chin, nipples, and lower abdomen, and elevated testosterone  levels.  Her obstetric history includes two early spontaneous abortions, one ectopic pregnancy, and one live birth.  An ultrasound did not show any pelvic masses. She reports current drug use. Drug: Marijuana.   PAP 04/09/24 normal. 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  History of cryotherapy for abnormal PAP 2006 per her history, cone biopsy 2013.  Referred to [facility] 6/25 to assume her care for follow up of history of recurrent granulosa cell tumor and 8 mm indeterminate peritoneal nodule.  Colposcopy for HR HPV18/45 was negative.   Problem List: Patient Active Problem List   Diagnosis Date Noted   Granulosa cell tumor of ovary 04/24/2024   Pregnancy of unknown anatomic location 02/09/2022   Bipolar 1 disorder (HCC)    Moderate dysplasia of cervix (CIN II) 01/31/2012   Cholelithiasis without obstruction 09/15/2011   Nausea 09/08/2011   Smoker 09/08/2011   Overweight    Back pain, chronic     Past Medical History: Past Medical History:  Diagnosis Date   Allergic rhinitis    Arthritis    Bipolar 1 disorder (HCC)    Depression    GERD (gastroesophageal reflux disease)    Migraine    Obese    Ovarian cancer (HCC)    PCOS (polycystic ovarian syndrome)    Vaginal delivery 12/12/2004    Past Surgical History: Past Surgical History:  Procedure Laterality Date   ANTERIOR CRUCIATE LIGAMENT REPAIR  2009   CERVICAL CONIZATION W/BX  01/31/2012   Procedure: CONIZATION CERVIX WITH BIOPSY;  Surgeon: Norleen LULLA Server, MD;  Location: [facility];  Service: Gynecology;  Laterality: N/A;  Cold Knife Conization of Cervix      OB History:  OB History  Gravida Para Term Preterm  AB Living  4 1 1  3 1   SAB IAB Ectopic Multiple Live Births          # Outcome Date GA Lbr Len/2nd Weight Sex Type Anes PTL Lv  4 AB           3 AB           2 AB           1 Term             Family History: Family History  Problem Relation Age of Onset   Arthritis Other        both parents & grandparents    Heart disease Other        grandparents   Hyperlipidemia Other        grandparents   Diabetes Paternal Aunt    Hypertension Maternal Grandmother    Anesthesia problems Neg Hx    Hypotension Neg Hx    Malignant hyperthermia Neg Hx    Pseudochol deficiency Neg Hx     Colon cancer in her paternal grandmother; Diabetes in her maternal grandmother; Lung cancer in her mother; Melanoma in her paternal grandfather.  Social History: Social History   Socioeconomic History   Marital status: Single    Spouse name: Not on file   Number of children: Not on file   Years of education: Not on file   Highest education level: Not on file  Occupational History   Not on file  Tobacco Use   Smoking status: Every Day    Current packs/day: 1.00    Average packs/day: 1 pack/day for 16.0 years (16.0 ttl pk-yrs)    Types: Cigarettes   Smokeless tobacco: Never  Vaping Use   Vaping status: Some Days   Substances: Nicotine , Flavoring  Substance and Sexual Activity   Alcohol use: Yes    Comment: occasional   Drug use: Yes    Frequency: 7.0 times per week    Types: Marijuana   Sexual activity: Yes    Birth control/protection: None, Condom  Other Topics Concern   Not on file  Social History Narrative   Single, lives with her son (born)   Unemployed, completed 9th grade   Social alcohol, ongoing tobacco   Social Drivers of Corporate investment banker Strain: Not on file  Food Insecurity: Food Insecurity Present (04/24/2024)   Hunger Vital Sign    Worried About Running Out of Food in the Last Year: Sometimes true    Ran Out of Food in the Last Year: Sometimes true  Transportation Needs: No Transportation Needs (04/24/2024)   PRAPARE - Administrator, Civil Service (Medical): No    Lack of Transportation (Non-Medical): No  Physical Activity: Not on file  Stress: Not on file  Social Connections: Not on file  Intimate Partner Violence: Not At Risk (04/24/2024)   Humiliation,  Afraid, Rape, and Kick questionnaire    Fear of Current or Ex-Partner: No    Emotionally Abused: No    Physically Abused: No    Sexually Abused: No    Allergies: Allergies  Allergen Reactions   Blue Dyes (Parenteral) Anaphylaxis and Itching    Throat swelling and itching     Current Medications: Current Outpatient Medications  Medication Sig Dispense Refill   busPIRone  (BUSPAR ) 10 MG tablet Take 1 tablet (10 mg total) by mouth once daily. (Patient not taking: Reported on 02/09/2022) 30 tablet 1   guanFACINE  (TENEX ) 1 MG tablet Take  1 tablet (1 mg total) by mouth once daily. (Patient not taking: Reported on 02/09/2022) 30 tablet 1   lamoTRIgine  (LAMICTAL ) 25 MG tablet Take 1 tablet (25 mg total) by mouth once daily. (Patient not taking: Reported on 02/09/2022) 30 tablet 1   methocarbamol  (ROBAXIN ) 500 MG tablet Take 1 tablet (500 mg total) by mouth 4 (four) times daily. 16 tablet 2   progesterone (PROMETRIUM) 200 MG capsule 1 po q hs x 10 hs     sertraline  (ZOLOFT ) 50 MG tablet Take (1/2) tablet (25 mg total) by mouth once daily. (Patient not taking: Reported on 02/09/2022) 15 tablet 1   No current facility-administered medications for this visit.    Review of Systems General: negative for, fevers, chills, fatigue, changes in sleep, changes in weight or appetite some headaches Skin: negative for changes in color, texture, moles or lesions Eyes: negative for, changes in vision, pain, diplopia HEENT: negative for, change in hearing, pain, discharge, tinnitus, vertigo, voice changes, sore throat, neck masses Breasts: breast lump on left Pulmonary: some cough Cardiac: negative for, palpitations, syncope, pain, discomfort, pressure Gastrointestinal: some N/V Genitourinary/Sexual: negative for, dysuria, discharge, hesitancy, nocturia, retention, stones, infections, STD's, incontinence Musculoskeletal: negative for, pain, stiffness, swelling, range of motion limitation Hematology: negative  for, easy bruising, bleeding Neurologic/Psych: negative for, headaches, seizures, paralysis, weakness, tremor, change in gait, change in sensation, mood swings, depression, anxiety, change in memory  Objective:  Physical Examination:  BP 133/76   Pulse 70   Temp 97.9 F (36.6 C)   Resp 20   Wt 287 lb 6.4 oz (130.4 kg)   SpO2 100%   BMI 42.44 kg/m    ECOG Performance Status: 0 - Asymptomatic  General appearance: alert, cooperative, and appears stated age HEENT:PERRLA. Missing teeth Lymph node survey: non-palpable, axillary, inguinal, supraclavicular Cardiovascular: regular rate and rhythm, no murmurs or gallops Respiratory: no rales, rhonchi or wheezing Abdomen: no hernias and well healed incision Back: inspection of back is normal Extremities: extremities normal, atraumatic, no cyanosis or edema Skin exam - normal coloration and turgor, no rashes, no suspicious skin lesions noted. Neurological exam reveals alert, oriented, normal speech, no focal findings or movement disorder noted.  Pelvic: exam chaperoned by nurse;  Vulva: normal appearing vulva with no masses, tenderness or lesions; Vagina: normal vagina; Adnexa: normal adnexa in size, nontender and no masses; Uterus: uterus is normal size, shape, consistency and nontender; Cervix: anteverted no lesions; Rectal: not indicated  Assessment:  VELVIE THOMASTON is a 38 y.o. P42 female diagnosed with granulosa cell tumor. She underwent laparascopic LSO on June 23, 2015 with Dr. Greig Shutter in [place] for large granulosa cell tumor. Tumor board recommendation was surveillance with inhibins and ultrasound. She was lost to follow up from 2017 to 2019.  11/17/2020 Inhibin B 146, XLAP, right ovarian cystectomy benign, excision of left adnexal and sigmoid eipiploic cysts positive for recurrent GCT (favor juvenile type) with Dr. Winda at [facility]. Omentum negative. No gross residual disease.  Recommended completion surgery with TAH/RSO and  chemotherapy. Patient declined due to desire to preserve fertility.  2023 Ectopic pregnancy treated with methotrexate .   04/09/24 seen by Dr Lovetta at [facility].  Oligomenorrhea and possible PCOS. Inhibin B 30.5, but still premenopausal. Estradiol , Testosterone normal and DHEAS low.  She is taking cyclic progestin.  CT scan 5/25. There is a new peritoneal nodule above the umbilicus 8 mm.   anteriorly within the abdomen just above the umbilicus measuring 8 mm  08/18/24 CT scan shows  increase in omental nodule/lymph node measures 10 mm previously 8 mm. Tiny nodule along the left pericolic gutter measures 7 mm previously 3 mm. . There is a19 mm right adrenal nodule slowly increasing in size from CT July 05, 2018 when it measured 15 mm possibly an adenoma although an indolent slow growing metastasis is not excluded.   Cervical dysplasia: 4/25 PAP normal but HR HPV+ (18, 45).  History of cryotherapy for abnormal PAP 2006 per history, cone biopsy 2013. Colposcopy of cervix normal 6/25.  Repeat screening in 1 year.    Benign left breast nodule at 6 o'clock, mammo/US  shows lipoma.     Medical co-morbidities complicating care: PCOS, smoker.  Plan:   Problem List Items Addressed This Visit       Endocrine   Granulosa cell tumor of ovary - Primary   We discussed that the anterior abdominal and left gutter nodules have grown slightly on CT scan since 4 months ago and may represent recurrent disease. Plan to repeat inhibin A/B today as well as FSH in view of oligomenorrhea.  She would like to get pregnant with her new husband, but wishes to prioritize her health and longevity over fertility. Only wants to conceive if it would be safe.  She only has one ovary and is probably oligomenorrheic and may have PCOS.    In view of the suspicion for recurrent disease we discussed options including completion surgery with removal of right ovary and uterus and the nodules seen on CT scan. This could be followed by  systemic therapy most likely letrozole.  Carboplatin/Taxol Chemotherapy would also be an option.  Her preference is to have surgery as the primary modality.  Discussed that estrogen stimulates growth of the tumor, so removal of the ovary and then letrozole to suppress peripheral estrogen production would maximize the endocrine approach.    We will present her case at the [facility] Tumor Board after the inhibin levels are available and I will see her back in two weeks to discuss management options further.   Repeat PAP/HPV in 6 months.     She will repeat breast US  in 2 months to follow up on probably benign mass.   The patient's diagnosis, an outline of the further diagnostic and laboratory studies which will be required, the recommendation, and alternatives were discussed.  All questions were answered to the patient's satisfaction.  Prentice Agent, MD  CC:  Department, [facility] [address] [place],  [place] [id number] [phone]

## 2024-08-29 LAB — INHIBIN B: Inhibin B: 35 pg/mL

## 2024-08-29 LAB — FOLLICLE STIMULATING HORMONE: FSH: 6.4 m[IU]/mL

## 2024-08-30 LAB — INHIBIN A: Inhibin-A: 3.2 pg/mL

## 2024-09-17 NOTE — Progress Notes (Unsigned)
 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 G4P1 female who is seen in consultation from Dr. Lovetta for recurrent granulosa cell tumor who returns to clinic for discussion of management.   She was seen by Dr Mancil on 08/28/24. At that time, Menses q79mo and desires conception when possible. Started on Progestin 7 days a month by Dr Lovetta.  She has an 52 yo child, but recently got married and would like to have another child if this does not compromise her own health and prognosis from GCT.    Recent CT scan shows slight growth of two small omental/peritoneal nodules since last scan 4 months ago (now 10 mm and 7 mm).  08/15/24- CT Abdomen pelvis w contrast IMPRESSION: 1. Increased size of 2 small omental/peritoneal nodules, findings are nonspecific but suspicious for metastatic disease. No significant ascites. Consider further evaluation by nuclear medicine PET-CT. 2. 19 mm right adrenal nodule slowly increasing in size from CT July 05, 2018 when it measured 15 mm possibly an adenoma although an indolent slow growing metastasis while not favored is not excluded. Suggest attention on follow-up imaging. 3. Diffuse hepatic steatosis. 4. Cholelithiasis without findings of acute cholecystitis. 5. Bibasilar atelectasis/scarring with ground-glass opacities, which may reflect atelectasis or infection.  08/28/24 FSH 6.4, Inhibin A 3.2, Inhibin B 35  Today she reiterates her strong desire to try to get pregnant again.    Granulosa cell carcinoma of left ovary  06/23/2015 Surgery  She underwent laparascopic LSO on June 23, 2015 with Dr. Greig Shutter in [place] for large granulosa cell tumor. Tumor board recommendation was surveillance with inhibins and ultrasound. She was lost to follow up from 2017 to 2019.  12/2017 Imaging  In 2019, she presented to the ED with  abdominal pain and CT revealed 5.3 cm right adnexal lesion. Differential included cystic neoplasm, hydrosalpinx, hemorrhagic cyst, or pelvic inflammatory process. She did not have any follow up.   06/12/2020 Tumor Markers  Patient's tumor was tested for the following markers: Inhibin A/B. Results of the tumor marker test revealed Inhibin A 10, Inhibin B 525.  09/28/2020 Imaging  She presented to OSH ED and had a CT scan on 10/18 that showed a right ovarian mass concerning for granulosa cell tumor and a left adnexal mass.  10/23/2020 Tumor Markers  Patient's tumor was tested for the following markers: Inhibin A/B. Results of the tumor marker test revealed Inhibin A 5.6, Inhibin B 146.  11/17/2020 Surgery  XLAP, right ovarian cystectomy, excision of left adnexal and eipiploic cysts, omentectomy. 1. Normal upper abdomen. Adhesions of omentum to anterior abdominal wall at previous incision. Omentum grossly normal. 2. Grossly normal uterus, 8 week size. 3. 3cm cystic lesion arising from the left uterine cornua, but surgically absent left ovary and fallopian tube. Frozen section, concern for possible recurrent granulosa cell tumor. 4. 2cm cystic lesions x 2 arrising from the sigmoid colon epiploica.  5. 2cm hemorrhagic lesion arising from right ovary. Frozen section consistent with hemorrhagic corpus luteum. 6. No there peritoneal disease   FINAL DIAGNOSIS   A. OMENTUM, PARTIAL OMENTECTOMY: BENIGN FIBROADIPOSE TISSUES WITH NO EVIDENCE OF MALIGNANCY.   B. SOFT TISSUE, EPIPLOIC APPENDAGE, EXCISION: METASTATIC GRANULOSA CELL TUMOR, FAVOR JUVENILE TYPE. (See comment.)  C. SOFT TISSUE, LEFT ADNEXA, EXCISION: RECURRENT GRANULOSA CELL TUMOR, FAVOR JUVENILE TYPE. (See comment.)  D. SOFT TISSUE, EPIPLOIC APPENDAGE, EXCISION: METASTATIC GRANULOSA CELL TUMOR, FAVOR JUVENILE  TYPE. (See comment.)  E. OVARY, RIGHT CYST, EXCISION: HEMORRHAGIC CORPUS LUTEAL CYST. NO EVIDENCE OF MALIGNANCY.   01/01/21  Dr Sotero Iles post op Discussed that standard of care would be completion staging with right salpingo oophorectomy as well as hysterectomy followed by consideration of adjuvant therapy likely with platinum-based chemotherapy. She and her partner are very interested in future fertility and would like to try for 1 more child. Discussed alternative options moving forward. First would be completion of adjuvant platinum-based chemotherapy without completion surgery and then attempting pregnancy. Second would be surveillance while trying for another pregnancy followed by completion staging following delivery. Follow-up discussion the patient and her partner would like to proceed with surveillance. We did discuss that while our pathologists think that this is a juvenile granulosa cell tumor on their pathology review that her treatment course thus far is not consistent with an aggressive juvenile type granulosa cell tumor given that her original diagnosis was in 2016. Egg retrieval and IVF in the future unfortunately not an option given cost.   She was treated for an ectopic pregnancy in 2023, which was managed with MTX, and her hormone levels returned to zero after follow-up.  Seen by Dr Geralyn at [facility] 04/09/24  Inhibin B 30.5, but still premenopausal. She has oligomenorrhea.  History of PCOS, and desires possible future fertility. One child who is now 26.  Estradiol , Testosterone normal and DHEAS low. She has been diagnosed with polycystic ovary syndrome (PCOS) through ultrasound and blood work, with symptoms including abnormal hair growth on her chin, nipples, and lower abdomen, and elevated testosterone levels.  Her obstetric history includes two early spontaneous abortions, one ectopic pregnancy, and one live birth.  An ultrasound did not show any pelvic masses. She reports current drug use. Drug: Marijuana.   PAP 04/09/24 normal. 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  History of cryotherapy for abnormal PAP  2006 per her history, cone biopsy 2013.  Referred to [facility] 6/25 to assume her care for follow up of history of recurrent granulosa cell tumor and 8 mm indeterminate peritoneal nodule.  Colposcopy for HR HPV18/45 was negative.   CT scan 08/18/24 FINDINGS: Lower chest: Bibasilar atelectasis/scarring with ground-glass opacities.   Hepatobiliary: Diffuse hepatic steatosis. No suspicious hepatic lesion. Cholelithiasis without findings of acute cholecystitis. No biliary ductal dilation.   Pancreas: No pancreatic ductal dilation or evidence of acute inflammation.   Spleen: No splenomegaly.   Adrenals/Urinary Tract: 19 mm right adrenal nodule slowly increasing in size from CT July 05, 2018 when it measured 15 mm possibly an adenoma although an indolent slow growing metastasis is not excluded. Left adrenal gland appears normal. No hydronephrosis. Kidneys demonstrate symmetric enhancement. Exophytic 2.1 cm right renal cyst. Urinary bladder is unremarkable for degree of distension.   Stomach/Bowel: Stomach is unremarkable for degree of distension. Radiopaque enteric contrast material traverses the ileocecal valve. No pathologic dilation of small or large bowel.   Vascular/Lymphatic: Normal caliber abdominal aorta. Smooth IVC contours. No pathologically enlarged abdominal or pelvic lymph nodes.   Reproductive: Uterus is unremarkable. Prominent right ovary again seen. No suspicious left adnexal mass.   Other: Small anterior omental nodule/lymph node measures 10 mm on image 52/3 previously 8 mm. Tiny nodule along the left pericolic gutter measures 7 mm on image 30/3 previously 3 mm. No significant abdominopelvic free fluid.   Musculoskeletal: No aggressive lytic or blastic lesion of bone.Thoracolumbar spondylosis.   IMPRESSION: 1. Increased size of 2 small omental/peritoneal nodules, findings are nonspecific  but suspicious for metastatic disease. No significant ascites. Consider further  evaluation by nuclear medicine PET-CT. 2. 19 mm right adrenal nodule slowly increasing in size from CT July 05, 2018 when it measured 15 mm possibly an adenoma although an indolent slow growing metastasis while not favored is not excluded. Suggest attention on follow-up imaging. 3. Diffuse hepatic steatosis. 4. Cholelithiasis without findings of acute cholecystitis. 5. Bibasilar atelectasis/scarring with ground-glass opacities, which may reflect atelectasis or infection.  CT scan 05/09/24 FINDINGS: CHEST: The visualized thyroid is normal other than a subcentimeter nodule in the right lobe. Thoracic aorta is normal in caliber. The main pulmonary artery is normal in caliber. The heart is normal in size. There is no free fluid or lymphadenopathy. The trachea and mainstem bronchi are patent. Subsegmental atelectatic changes are noted within the lung bases. The lungs are otherwise clear.   ABDOMEN/PELVIS: The liver appears normal. There is cholelithiasis. The spleen is normal. The pancreas is normal. The left adrenal is normal. There is slight interval increase in size of the right adrenal adenoma. The left kidney is normal. Right renal cyst is seen. The abdominal aorta is normal in caliber. Scattered atherosclerotic changes are present. The bladder is normal. The uterus appears normal. There is prominence of the right ovary measuring 5.0 cm.   The appendix is normal. Large and small bowel loops are otherwise within normal limits. No free fluid or adenopathy. There is a new peritoneal nodule anteriorly within the abdomen just above the umbilicus measuring 8 mm (series 2 image 81).   BONES: There are degenerative changes of the spine.   IMPRESSION: Nonspecific new subcentimeter peritoneal nodule, for which early peritoneal carcinomatosis is not excluded given the patient's history. Short-term follow-up recommended.   Nonspecific prominence of the right ovary.  Mammo 05/03/24 IMPRESSION: 1. There  is a prominent fat lobule versus lipoma at the site of palpable concern in the LEFT breast. Any further workup of the patient's symptoms should be based on the clinical assessment. 2. No mammographic evidence of malignancy in the LEFT breast.  3. There is incidental sonographic note of a probably benign RIGHT breast mass. Option for short-term follow-up versus definitive characterization with biopsy was discussed with patient. Patient would prefer to proceed with short-term follow-up. As such, recommend RIGHT breast ultrasound in 6 months. This will establish 6 months of definitive stability.  Breast US  05/03/24 IMPRESSION: 1. There is a prominent fat lobule versus lipoma at the site of palpable concern in the LEFT breast. Any further workup of the patient's symptoms should be based on the clinical assessment. 2. No mammographic evidence of malignancy in the LEFT breast. 3. There is incidental sonographic note of a probably benign RIGHT breast mass. Option for short-term follow-up versus definitive characterization with biopsy was discussed with patient. Patient would prefer to proceed with short-term follow-up. As such, recommend RIGHT breast ultrasound in 6 months. This will establish 6 months of definitive stability.   RECOMMENDATION: RIGHT breast ultrasound in 6 months.  Problem List: Patient Active Problem List   Diagnosis Date Noted   Granulosa cell tumor of ovary 04/24/2024   Pregnancy of unknown anatomic location 02/09/2022   Bipolar 1 disorder (HCC)    Moderate dysplasia of cervix (CIN II) 01/31/2012   Cholelithiasis without obstruction 09/15/2011   Nausea 09/08/2011   Smoker 09/08/2011   Overweight    Back pain, chronic     Past Medical History: Past Medical History:  Diagnosis Date   Allergic rhinitis  Arthritis    Bipolar 1 disorder (HCC)    Depression    GERD (gastroesophageal reflux disease)    Migraine    Obese    Ovarian cancer (HCC)    PCOS (polycystic ovarian  syndrome)    Vaginal delivery 12/12/2004    Past Surgical History: Past Surgical History:  Procedure Laterality Date   ANTERIOR CRUCIATE LIGAMENT REPAIR  2009   CERVICAL CONIZATION W/BX  01/31/2012   Procedure: CONIZATION CERVIX WITH BIOPSY;  Surgeon: Norleen LULLA Server, MD;  Location: [facility];  Service: Gynecology;  Laterality: N/A;  Cold Knife Conization of Cervix      OB History:  OB History  Gravida Para Term Preterm AB Living  4 1 1  3 1   SAB IAB Ectopic Multiple Live Births          # Outcome Date GA Lbr Len/2nd Weight Sex Type Anes PTL Lv  4 AB           3 AB           2 AB           1 Term             Family History: Family History  Problem Relation Age of Onset   Arthritis Other        both parents & grandparents   Heart disease Other        grandparents   Hyperlipidemia Other        grandparents   Diabetes Paternal Aunt    Hypertension Maternal Grandmother    Anesthesia problems Neg Hx    Hypotension Neg Hx    Malignant hyperthermia Neg Hx    Pseudochol deficiency Neg Hx     Colon cancer in her paternal grandmother; Diabetes in her maternal grandmother; Lung cancer in her mother; Melanoma in her paternal grandfather.  Social History: Social History   Socioeconomic History   Marital status: Single    Spouse name: Not on file   Number of children: Not on file   Years of education: Not on file   Highest education level: Not on file  Occupational History   Not on file  Tobacco Use   Smoking status: Every Day    Current packs/day: 1.00    Average packs/day: 1 pack/day for 16.0 years (16.0 ttl pk-yrs)    Types: Cigarettes   Smokeless tobacco: Never  Vaping Use   Vaping status: Some Days   Substances: Nicotine , Flavoring  Substance and Sexual Activity   Alcohol use: Yes    Comment: occasional   Drug use: Yes    Frequency: 7.0 times per week    Types: Marijuana   Sexual activity: Yes    Birth control/protection: None, Condom  Other Topics Concern    Not on file  Social History Narrative   Single, lives with her son (born)   Unemployed, completed 9th grade   Social alcohol, ongoing tobacco   Social Drivers of Corporate investment banker Strain: Not on file  Food Insecurity: Food Insecurity Present (04/24/2024)   Hunger Vital Sign    Worried About Running Out of Food in the Last Year: Sometimes true    Ran Out of Food in the Last Year: Sometimes true  Transportation Needs: No Transportation Needs (04/24/2024)   PRAPARE - Administrator, Civil Service (Medical): No    Lack of Transportation (Non-Medical): No  Physical Activity: Not on file  Stress: Not on file  Social  Connections: Not on file  Intimate Partner Violence: Not At Risk (04/24/2024)   Humiliation, Afraid, Rape, and Kick questionnaire    Fear of Current or Ex-Partner: No    Emotionally Abused: No    Physically Abused: No    Sexually Abused: No    Allergies: Allergies  Allergen Reactions   Blue Dyes (Parenteral) Anaphylaxis and Itching    Throat swelling and itching     Current Medications: Current Outpatient Medications  Medication Sig Dispense Refill   busPIRone  (BUSPAR ) 10 MG tablet Take 1 tablet (10 mg total) by mouth once daily. (Patient not taking: Reported on 02/09/2022) 30 tablet 1   cephALEXin  (KEFLEX ) 500 MG capsule Take 500 mg by mouth every 6 (six) hours.     guanFACINE  (TENEX ) 1 MG tablet Take 1 tablet (1 mg total) by mouth once daily. (Patient not taking: Reported on 02/09/2022) 30 tablet 1   lamoTRIgine  (LAMICTAL ) 25 MG tablet Take 1 tablet (25 mg total) by mouth once daily. (Patient not taking: Reported on 02/09/2022) 30 tablet 1   methocarbamol  (ROBAXIN ) 500 MG tablet Take 1 tablet (500 mg total) by mouth 4 (four) times daily. 16 tablet 2   progesterone (PROMETRIUM) 200 MG capsule 1 po q hs x 10 hs     sertraline  (ZOLOFT ) 50 MG tablet Take (1/2) tablet (25 mg total) by mouth once daily. (Patient not taking: Reported on 02/09/2022) 15 tablet  1   No current facility-administered medications for this visit.    Review of Systems General:  no complaints Skin: no complaints Eyes: no complaints HEENT: no complaints Breasts: no complaints Pulmonary: no complaints Cardiac: no complaints Gastrointestinal: no complaints Genitourinary/Sexual: no complaints Ob/Gyn: no complaints Musculoskeletal: no complaints Hematology: no complaints Neurologic/Psych: no complaints   Objective:  Physical Examination:  BP 109/73   Pulse (!) 59   Temp (!) 96.6 F (35.9 C)   Resp 19   Wt 285 lb 8 oz (129.5 kg)   SpO2 99%   BMI 42.16 kg/m    ECOG Performance Status: 0 - Asymptomatic  GENERAL: Patient is a well appearing female in no acute distress HEENT:  Sclera clear. Anicteric NODES:  Negative axillary, supraclavicular, inguinal lymph node survery LUNGS:  Clear to auscultation bilaterally.   HEART:  Regular rate and rhythm.  ABDOMEN:  Soft, nontender.  No hernias, incisions well healed. No masses or ascites EXTREMITIES:  No peripheral edema. Atraumatic. No cyanosis SKIN:  Clear with no obvious rashes or skin changes.  NEURO:  Nonfocal. Well oriented.  Appropriate affect.  Pelvic: exam chaperoned by nurse;  Vulva: normal appearing vulva with no masses, tenderness or lesions; Vagina: normal vagina; Adnexa: normal adnexa in size, nontender and no masses; Uterus: uterus is normal size, shape, consistency and nontender; Cervix: anteverted no lesions; Rectal: not indicated  Assessment:  POLA FURNO is a 38 y.o. P32 female diagnosed with granulosa cell tumor. She underwent laparascopic LSO on June 23, 2015 with Dr. Greig Shutter in [place] for large granulosa cell tumor. Tumor board recommendation was surveillance with inhibins and ultrasound. She was lost to follow up from 2017 to 2019.  11/17/2020 Inhibin B 146 and had XLAP, right ovarian cystectomy benign, excision of left adnexal and sigmoid eipiploic cysts positive for recurrent  GCT (favor juvenile type) with Dr. Winda at [facility]. Omentum negative. No gross residual disease.  Recommended completion surgery with TAH/RSO and chemotherapy. Patient declined due to desire to preserve fertility.  2023 Ectopic pregnancy treated with methotrexate .   04/09/24  seen by Dr Lovetta at [facility].  Oligomenorrhea and possible PCOS. Inhibin B 30.5, but still premenopausal. Estradiol , Testosterone normal and DHEAS low.  She is taking cyclic progestin. CT scan 5/25. There is a new peritoneal nodule above the umbilicus 8 mm anteriorly within the abdomen just above the umbilicus measuring 8 mm  08/18/24 CT scan shows increase in omental nodule/lymph node measures 10 mm previously 8 mm. Tiny nodule along the left pericolic gutter measures 7 mm previously 3 mm. . There is a19 mm right adrenal nodule slowly increasing in size from CT July 05, 2018 when it measured 15 mm possibly an adenoma although an indolent slow growing metastasis is not excluded.   Cervical dysplasia: 4/25 PAP normal but HR HPV+ (18, 45).  History of cryotherapy for abnormal PAP 2006 per history, cone biopsy 2013. Colposcopy of cervix normal 6/25.  Repeat screening in 1 year.    08/28/24: FSH 6.4, Inhibin A 3.2, Inhibin B 35  Benign left breast nodule at 6 o'clock, mammo/US  shows lipoma.     Medical co-morbidities complicating care: PCOS, smoker.  Plan:   Problem List Items Addressed This Visit       Endocrine   Granulosa cell tumor of ovary, left - Primary    We discussed that the anterior abdominal and left gutter nodules have grown slightly on CT scan since 4 months ago and may represent recurrent disease. Inhibin A/B not rising appreciably though.  We also obtained an FSH level in view of oligomenorrhea and she is still premenopausal.  She reiterates that she would like to get pregnant with her new husband, but is also concerned about her health and longevity.  She only has one ovary and is probably oligomenorrheic and may  have PCOS.  So her chance of getting pregnant may be low.  And she had an ectopic pregnancy in the tube two years ago that was treated with methotrexate .   I discussed this case with my partner Dr. Eloy to get another opinion and she is in agreement with my thoughts below.    In view of the suspicion for recurrent disease we again discussed options including completion surgery with removal of right ovary and uterus and the nodules seen on CT scan. This could be followed by systemic such as letrozole.  Carboplatin/Taxol Chemotherapy would also be an option.  Discussed that estrogen stimulates growth of the tumor, so removal of the ovary and then letrozole to suppress peripheral estrogen production would maximize the endocrine approach.  Alternatively, she could just have the surgery and then take HRT.  Granulosa cell tumor even when metastatic is often an indolent disease, as witnessed by the fact that she has already had it for 9 years.  It is also a relatively rare disease, so the evidence base that underlies management is not strong.  Although we typically advocate completion surgery, particularly in the context of recurrence, the evidence that this improves long term outcomes when there is no involvement of the uterus or remaining ovary is lacking and this is a category 2B recommendation in the NCCN guidelines.     We discussed that her long term prognosis is somewhat guarded and uncertain and that although she is healthy now she could die due to recurrent disease while a child was still young.  She still wants to attempt pregnancy after our conversation. She will see Dr Lovetta to discuss stopping monthly progestin.    We will see her in 3 months with repeat inhibins and  6 months with CT scan to closely monitor for progression of GCT.  Continue to monitor adrenal nodule with imaging, as it is small and has only grown slightly in the past 6 years and is likely unrelated to her GCT.   Repeat  PAP/HPV in 6 months.     She will repeat breast US  in 2 months to follow up on probably benign mass.   The patient's diagnosis, an outline of the further diagnostic and laboratory studies which will be required, the recommendation, and alternatives were discussed.  All questions were answered to the patient's satisfaction.  Tinnie Dawn, DNP, AGNP-C, AOCNP [facility] [phone] (clinic)  I personally had a face to face interaction and evaluated the patient jointly with the NP, Ms. Tinnie Dawn.  I have reviewed her history and available records and have performed the key portions of the physical exam including lymph node survey, abdominal exam, pelvic exam with my findings confirming those documented above by the APP.  I have discussed the case with the APP and the patient.  I agree with the above documentation, assessment and plan which was fully formulated by me.  Counseling was completed by me.   I personally saw the patient and performed a substantive portion of this encounter in conjunction with the listed APP as documented above.  Prentice Agent, MD  CC:  Department, [facility] [address] [place],  [place] [id number] [phone]

## 2024-09-18 ENCOUNTER — Inpatient Hospital Stay: Payer: MEDICAID | Attending: Obstetrics and Gynecology | Admitting: Obstetrics and Gynecology

## 2024-09-18 VITALS — BP 109/73 | HR 59 | Temp 96.6°F | Resp 19 | Wt 285.5 lb

## 2024-09-18 DIAGNOSIS — Z90721 Acquired absence of ovaries, unilateral: Secondary | ICD-10-CM | POA: Diagnosis not present

## 2024-09-18 DIAGNOSIS — D3910 Neoplasm of uncertain behavior of unspecified ovary: Secondary | ICD-10-CM

## 2024-09-18 DIAGNOSIS — Z8603 Personal history of neoplasm of uncertain behavior: Secondary | ICD-10-CM | POA: Diagnosis not present

## 2024-11-04 ENCOUNTER — Other Ambulatory Visit: Payer: MEDICAID

## 2024-11-05 ENCOUNTER — Ambulatory Visit (INDEPENDENT_AMBULATORY_CARE_PROVIDER_SITE_OTHER): Payer: MEDICAID

## 2024-11-05 ENCOUNTER — Ambulatory Visit
Admission: EM | Admit: 2024-11-05 | Discharge: 2024-11-05 | Disposition: A | Discharge status: Home / Self Care | Payer: MEDICAID

## 2024-11-05 DIAGNOSIS — R052 Subacute cough: Secondary | ICD-10-CM

## 2024-11-05 DIAGNOSIS — J069 Acute upper respiratory infection, unspecified: Secondary | ICD-10-CM

## 2024-11-05 MED ORDER — BENZONATATE 100 MG PO CAPS: 200.0000 mg | ORAL_CAPSULE | Freq: Three times a day (TID) | ORAL | 0 refills | Status: AC

## 2024-11-05 MED ORDER — AMOXICILLIN-POT CLAVULANATE 875-125 MG PO TABS: 1.0000 | ORAL_TABLET | Freq: Two times a day (BID) | ORAL | 0 refills | Status: DC

## 2024-11-05 MED ORDER — AEROCHAMBER MV MISC: 2 refills | Status: DC

## 2024-11-05 MED ORDER — IPRATROPIUM BROMIDE 0.06 % NA SOLN: 2.0000 | Freq: Four times a day (QID) | NASAL | 12 refills | Status: DC

## 2024-11-05 MED ORDER — ALBUTEROL SULFATE HFA 108 (90 BASE) MCG/ACT IN AERS: 2.0000 | INHALATION_SPRAY | RESPIRATORY_TRACT | 0 refills | Status: AC | PRN

## 2024-11-05 MED ORDER — PROMETHAZINE-DM 6.25-15 MG/5ML PO SYRP: 5.0000 mL | ORAL_SOLUTION | Freq: Four times a day (QID) | ORAL | 0 refills | Status: DC | PRN

## 2024-11-05 NOTE — ED Provider Notes (Signed)
 [facility]    CSN: [id number] Arrival date & time: 11/05/24  9156      History   Chief Complaint No chief complaint on file.   HPI Jacqueline Morrow is a 38 y.o. female.   HPI  70 old female with past medical history significant for PCOS, ovarian cancer, obesity, migraine headaches, GERD, depression, bipolar 1 disorder, allergic rhinitis, and arthritis who presents for evaluation of 10 days worth of respiratory symptoms to include headache, body aches, runny nose, nasal congestion, and cough that is productive for a green sputum with associated shortness of breath and wheezing.  No fever or sore throat.  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   Cholelithiasis without obstruction 09/15/2011   Smoker 09/08/2011   Overweight    Back pain, chronic    Depression 05/15/2007    Past Surgical History:  Procedure Laterality Date   ANTERIOR CRUCIATE LIGAMENT REPAIR  2009   CERVICAL CONIZATION W/BX  01/31/2012   Procedure: CONIZATION CERVIX WITH BIOPSY;  Surgeon: Norleen LULLA Server, MD;  Location: [facility];  Service: Gynecology;  Laterality: N/A;  Cold Knife Conization of Cervix    OB History     Gravida  4   Para  1   Term  1   Preterm      AB  3   Living  1      SAB      IAB      Ectopic      Multiple      Live Births               Home Medications    Prior to  Admission medications   Medication Sig Start Date End Date Taking? Authorizing Provider  albuterol  (VENTOLIN  HFA) 108 (90 Base) MCG/ACT inhaler Inhale 2 puffs into the lungs every 4 (four) hours as needed. 11/05/24  Yes Bernardino Ditch, NP  amoxicillin -clavulanate (AUGMENTIN ) 875-125 MG tablet Take 1 tablet by mouth every 12 (twelve) hours for 7 days. 11/05/24 11/12/24 Yes Bernardino Ditch, NP  benzonatate  (TESSALON ) 100 MG capsule Take 2 capsules (200 mg total) by mouth every 8 (eight) hours. 11/05/24  Yes Bernardino Ditch, NP  chlorhexidine (PERIDEX) 0.12 % solution Use as directed 5 mLs in the mouth or throat. 10/28/24  Yes [provider]  HYDROcodone -acetaminophen  (NORCO/VICODIN) 5-325 MG tablet Take 1 tablet by mouth every 4 (four) hours as needed for severe pain (pain score 7-10). 10/28/24  Yes [provider]  ipratropium (ATROVENT ) 0.06 % nasal spray Place 2 sprays into both nostrils 4 (four) times daily. 11/05/24  Yes Bernardino Ditch, NP  promethazine -dextromethorphan (PROMETHAZINE -DM) 6.25-15 MG/5ML syrup Take 5 mLs by mouth 4 (four) times daily as needed. 11/05/24  Yes Bernardino Ditch, NP  Spacer/Aero-Holding Chambers (AEROCHAMBER MV) inhaler Use as instructed 11/05/24  Yes Bernardino Ditch, NP    Family History Family History  Problem Relation Age of Onset   Arthritis Other        both parents & grandparents   Heart disease Other        grandparents   Hyperlipidemia Other        grandparents   Diabetes Paternal Aunt    Hypertension Maternal Grandmother    Anesthesia problems Neg Hx    Hypotension Neg Hx    Malignant hyperthermia Neg Hx    Pseudochol deficiency Neg Hx     Social History Social History   Tobacco Use   Smoking status: Every Day    Current packs/day: 1.00    Average packs/day: 1 pack/day for 16.0 years (16.0 ttl pk-yrs)    Types: Cigarettes   Smokeless tobacco: Never  Vaping Use   Vaping status: Some Days   Substances: Nicotine , Flavoring  Substance Use  Topics   Alcohol use: Yes    Comment: occasional   Drug use: Yes    Frequency: 7.0 times per week    Types: Marijuana     Allergies   Patient has no active allergies.   Review of Systems Review of Systems  Constitutional:  Negative for fever.  HENT:  Positive for congestion and rhinorrhea. Negative for ear pain and sore throat.   Respiratory:  Positive for cough, shortness of breath and wheezing.   Musculoskeletal:  Positive for arthralgias and myalgias.  Neurological:  Positive for headaches.     Physical Exam Triage Vital Signs ED Triage Vitals  Encounter Vitals Group     BP      Girls Systolic BP Percentile      Girls Diastolic BP Percentile      Boys Systolic BP Percentile      Boys Diastolic BP Percentile      Pulse      Resp      Temp      Temp src      SpO2      Weight      Height      Head Circumference      Peak Flow      Pain Score      Pain Loc      Pain Education      Exclude from Growth Chart    No data found.  Updated Vital Signs BP 128/83 (BP Location: Right Arm)   Pulse 62   Temp 98.6 F (37 C) (Oral)   Resp 16   LMP  (LMP Unknown)   SpO2 97%   Breastfeeding No   Visual Acuity Right Eye Distance:   Left Eye Distance:   Bilateral Distance:    Right Eye Near:   Left Eye Near:    Bilateral Near:     Physical Exam Vitals and nursing note reviewed.  Constitutional:      Appearance: Normal appearance. She is not ill-appearing.  HENT:     Head: Normocephalic and atraumatic.     Nose: Congestion and rhinorrhea present.     Comments: Nasal mucosa is erythematous and edematous with clear discharge in both eyes.    Mouth/Throat:     Mouth: Mucous membranes are moist.     Pharynx: Oropharynx is clear. No oropharyngeal exudate or posterior oropharyngeal erythema.  Cardiovascular:     Rate and Rhythm: Normal rate and regular rhythm.     Pulses: Normal pulses.     Heart sounds: Normal heart sounds. No murmur  heard.    No friction rub.  No gallop.  Pulmonary:     Effort: Pulmonary effort is normal.     Breath sounds: Normal breath sounds. No wheezing, rhonchi or rales.  Musculoskeletal:     Cervical back: Normal range of motion and neck supple. No tenderness.  Lymphadenopathy:     Cervical: No cervical adenopathy.  Skin:    General: Skin is warm and dry.     Capillary Refill: Capillary refill takes less than 2 seconds.     Findings: No erythema or rash.  Neurological:     General: No focal deficit present.     Mental Status: She is alert and oriented to person, place, and time.      UC Treatments / Results  Labs (all labs ordered are listed, but only abnormal results are displayed) Labs Reviewed - No data to display  EKG   Radiology DG Chest 2 View Result Date: 11/05/2024 CLINICAL DATA:  Ten day history of cough, congestion, and body aches EXAM: CHEST - 2 VIEW COMPARISON:  Chest radiograph dated 06/24/2012 FINDINGS: Normal lung volumes. No focal consolidations. No pleural effusion or pneumothorax. The heart size and mediastinal contours are within normal limits. No acute osseous abnormality. IMPRESSION: No active cardiopulmonary disease. Electronically Signed   By: Limin  Xu M.D.   On: 11/05/2024 10:58    Procedures Procedures (including critical care time)  Medications Ordered in UC Medications - No data to display  Initial Impression / Assessment and Plan / UC Course  I have reviewed the triage vital signs and the nursing notes.  Pertinent labs & imaging results that were available during my care of the patient were reviewed by me and considered in my medical decision making (see chart for details).   Patient is a nontoxic-appearing 68 old female present for evaluation 10 days with the respiratory symptoms as outlined in HPI above.  She is reporting runny nose, nasal congestion, and productive cough though she has not coughed once in the exam room and she is not experiencing any significant nasal  discharge.  She does not have very mild erythema and edema of her nasal mucosa with clear discharge in both nares.  Her lungs are clear to auscultation all fields.  However, given that she has had symptoms for 10 days, I will obtain a chest x-ray to evaluate for any acute cardiopulmonary pathology.  Chest x-ray independently reviewed and evaluated by me.  Impression: Lung fields are well aerated without evidence of infiltrate or effusion.  Cardiomediastinal silhouette appears normal.  Radiology read is pending. Radiology impression states no active cardiopulmonary disease.  I will discharge patient on the diagnosis of URI with cough and congestion.  I will start her on Augmentin  875 mg twice daily with food for 7 days along with Atrovent  nasal spray for the nasal congestion and Tessalon  Perles and Promethazine  DM cough syrup for cough and congestion.  Additionally, I will prescribe an albuterol  inhaler and spacer and she can do 1 to 2 puffs every 4-6 hours as needed for shortness breath or wheezing.  Return precautions reviewed.   Final Clinical Impressions(s) / UC Diagnoses   Final diagnoses:  Subacute cough  URI with cough and congestion     Discharge Instructions      Your chest x-ray did not show any evidence of pneumonia but your exam is consistent with an upper respiratory tract infection.  Given that she has had symptoms for 10 days a trial of antibiotics is  warranted.  Take the Augmentin  875 mg twice daily with food for 7 days for treatment of your URI.  Use the albuterol  inhaler, with the spacer, and take 1 to 2 puffs every 4-6 hours as needed for shortness breath or wheezing.  Use over-the-counter Tylenol  and/or ibuprofen  as needed for fever or pain.  Use the Atrovent  nasal spray, 2 squirts in each nostril every 6 hours, as needed for runny nose and postnasal drip.  Use the Tessalon  Perles every 8 hours during the day.  Take them with a small sip of water .  They may give you  some numbness to the base of your tongue or a metallic taste in your mouth, this is normal.  Use the Promethazine  DM cough syrup at bedtime for cough and congestion.  It will make you drowsy so do not take it during the day.  Return for reevaluation or see your primary care provider for any new or worsening symptoms.      ED Prescriptions     Medication Sig Dispense Auth. Provider   Spacer/Aero-Holding Chambers (AEROCHAMBER MV) inhaler Use as instructed 1 each Bernardino Ditch, NP   albuterol  (VENTOLIN  HFA) 108 (90 Base) MCG/ACT inhaler Inhale 2 puffs into the lungs every 4 (four) hours as needed. 18 g Bernardino Ditch, NP   amoxicillin -clavulanate (AUGMENTIN ) 875-125 MG tablet Take 1 tablet by mouth every 12 (twelve) hours for 7 days. 14 tablet Bernardino Ditch, NP   benzonatate  (TESSALON ) 100 MG capsule Take 2 capsules (200 mg total) by mouth every 8 (eight) hours. 21 capsule Bernardino Ditch, NP   ipratropium (ATROVENT ) 0.06 % nasal spray Place 2 sprays into both nostrils 4 (four) times daily. 15 mL Bernardino Ditch, NP   promethazine -dextromethorphan (PROMETHAZINE -DM) 6.25-15 MG/5ML syrup Take 5 mLs by mouth 4 (four) times daily as needed. 118 mL Bernardino Ditch, NP      PDMP not reviewed this encounter.   Bernardino Ditch, NP 11/05/24 1104

## 2024-11-05 NOTE — ED Triage Notes (Signed)
 Pt reports cough, congestion, headache, body aches x 10 days

## 2024-11-05 NOTE — Discharge Instructions (Addendum)
 Your chest x-ray did not show any evidence of pneumonia but your exam is consistent with an upper respiratory tract infection.  Given that she has had symptoms for 10 days a trial of antibiotics is warranted.  Take the Augmentin  875 mg twice daily with food for 7 days for treatment of your URI.  Use the albuterol  inhaler, with the spacer, and take 1 to 2 puffs every 4-6 hours as needed for shortness breath or wheezing.  Use over-the-counter Tylenol  and/or ibuprofen  as needed for fever or pain.  Use the Atrovent  nasal spray, 2 squirts in each nostril every 6 hours, as needed for runny nose and postnasal drip.  Use the Tessalon  Perles every 8 hours during the day.  Take them with a small sip of water .  They may give you some numbness to the base of your tongue or a metallic taste in your mouth, this is normal.  Use the Promethazine  DM cough syrup at bedtime for cough and congestion.  It will make you drowsy so do not take it during the day.  Return for reevaluation or see your primary care provider for any new or worsening symptoms.

## 2024-11-11 ENCOUNTER — Ambulatory Visit
Admission: EM | Admit: 2024-11-11 | Discharge: 2024-11-11 | Disposition: A | Discharge status: Home / Self Care | Payer: MEDICAID | Attending: Emergency Medicine | Admitting: Emergency Medicine

## 2024-11-11 ENCOUNTER — Ambulatory Visit (INDEPENDENT_AMBULATORY_CARE_PROVIDER_SITE_OTHER): Payer: MEDICAID

## 2024-11-11 DIAGNOSIS — H66004 Acute suppurative otitis media without spontaneous rupture of ear drum, recurrent, right ear: Secondary | ICD-10-CM

## 2024-11-11 DIAGNOSIS — J209 Acute bronchitis, unspecified: Secondary | ICD-10-CM

## 2024-11-11 DIAGNOSIS — R051 Acute cough: Secondary | ICD-10-CM

## 2024-11-11 DIAGNOSIS — J0100 Acute maxillary sinusitis, unspecified: Secondary | ICD-10-CM

## 2024-11-11 MED ORDER — CEFDINIR 300 MG PO CAPS: 300.0000 mg | ORAL_CAPSULE | Freq: Two times a day (BID) | ORAL | 0 refills | Status: AC

## 2024-11-11 MED ORDER — PREDNISONE 20 MG PO TABS: 40.0000 mg | ORAL_TABLET | Freq: Every day | ORAL | 0 refills | Status: AC

## 2024-11-11 MED ORDER — IPRATROPIUM-ALBUTEROL 0.5-2.5 (3) MG/3ML IN SOLN
3.0000 mL | Freq: Once | RESPIRATORY_TRACT | Status: AC
  Administered 2024-11-11: 3 mL via RESPIRATORY_TRACT

## 2024-11-11 MED ORDER — AEROCHAMBER PLUS FLO-VU MEDIUM MISC
1.0000 | Freq: Once | Status: AC
  Administered 2024-11-11: 1

## 2024-11-11 MED ORDER — ALBUTEROL SULFATE (2.5 MG/3ML) 0.083% IN NEBU
2.5000 mg | INHALATION_SOLUTION | Freq: Once | RESPIRATORY_TRACT | Status: AC
  Administered 2024-11-11: 2.5 mg via RESPIRATORY_TRACT

## 2024-11-11 NOTE — Discharge Instructions (Signed)
 I did not appreciate a pneumonia on your x-ray.  However, finish the Omnicef , even if you feel better.  Take two puffs from your albuterol  inhaler with your spacer every 4 hours for 2 days, then every 6 hours for 2 days, then as needed. You can back off if you start to improve  sooner. Finish the steroids unless your doctor tells you to stop. Finish the antibiotics, even if you feel better. Take tylenol  1 gram combined with  600 mg of motrin  up to 3-4 times a day as needed for pain. Make sure you drink extra fluids. Return to the ER if you get worse, have a fever >100.4, or any other concerns.   We have given you a spacer here.  Make sure you use it with your inhaler.  Start Mucinex-D to keep the mucous thin and to decongest you.  Use a NeilMed sinus rinse with distilled water  as often as you want to to reduce nasal congestion. Follow the directions on the box.  Flonase .  Go to www.goodrx.com to look up your medications. This will give you a list of where you can find your prescriptions at the most affordable prices. Or you can ask the pharmacist what the cash price is. This is frequently cheaper than going through insurance.    Go to www.goodrx.com  or www.costplusdrugs.com to look up your medications. This will give you a list of where you can find your prescriptions at the most affordable prices. Or ask the pharmacist what the cash price is, or if they have any other discount programs available to help make your medication more affordable. This can be less expensive than what you would pay with insurance.

## 2024-11-11 NOTE — ED Provider Notes (Signed)
 HPI  SUBJECTIVE:  Jacqueline Morrow is a 38 y.o. female who presents with 2 weeks of chest congestion, nasal congestion, clear rhinorrhea, sinus pain and pressure, right ear pain, headache, cough productive of green sputum, wheezing, diffuse chest soreness from coughing, shortness of breath and occasional posttussive emesis.  She is unable to sleep at night because of the cough.  No change in hearing, otorrhea, facial swelling, dental pain, fevers above 100.4.  She was seen on 11/25 and prescribed Augmentin , albuterol , Tessalon , Promethazine  DM and Atrovent  nasal spray for subacute cough.  She states the albuterol  helps.  She is still on Augmentin  and states that she is getting worse.  She has also tried Robitussin AM/PM, Tylenol  and ibuprofen .  Symptoms are worse with large positional changes.  No antipyretic in the past 6 hours.  She is a smoker, has a history of ovarian cancer and PCOS.  No history of pulmonary disease.  LMP: In the summer.  Denies the possibility of being pregnant.  PCP: She plans to establish care with [facility] in the beginning of March 2026    Past Medical History:  Diagnosis Date   Allergic rhinitis    Arthritis    Bipolar 1 disorder (HCC)    Depression    GERD (gastroesophageal reflux disease)    Migraine    Obese    Ovarian cancer (HCC)    PCOS (polycystic ovarian syndrome)    Vaginal delivery 12/12/2004    Past Surgical History:  Procedure Laterality Date   ANTERIOR CRUCIATE LIGAMENT REPAIR  2009   CERVICAL CONIZATION W/BX  01/31/2012   Procedure: CONIZATION CERVIX WITH BIOPSY;  Surgeon: Norleen LULLA Server, MD;  Location: [facility];  Service: Gynecology;  Laterality: N/A;  Cold Knife Conization of Cervix    Family History  Problem Relation Age of Onset   Arthritis Other        both parents & grandparents   Heart disease Other        grandparents   Hyperlipidemia Other        grandparents   Diabetes Paternal Aunt    Hypertension Maternal  Grandmother    Anesthesia problems Neg Hx    Hypotension Neg Hx    Malignant hyperthermia Neg Hx    Pseudochol deficiency Neg Hx     Social History   Tobacco Use   Smoking status: Every Day    Current packs/day: 1.00    Average packs/day: 1 pack/day for 16.0 years (16.0 ttl pk-yrs)    Types: Cigarettes   Smokeless tobacco: Never  Vaping Use   Vaping status: Some Days   Substances: Nicotine , Flavoring  Substance Use Topics   Alcohol use: Yes    Comment: occasional   Drug use: Yes    Frequency: 7.0 times per week    Types: Marijuana    No current facility-administered medications for this encounter.  Current Outpatient Medications:    cefdinir (OMNICEF) 300 MG capsule, Take 1 capsule (300 mg total) by mouth 2 (two) times daily for 10 days., Disp: 20 capsule, Rfl: 0   predniSONE (DELTASONE) 20 MG tablet, Take 2 tablets (40 mg total) by mouth daily with breakfast for 5 days., Disp: 10 tablet, Rfl: 0   albuterol  (VENTOLIN  HFA) 108 (90 Base) MCG/ACT inhaler, Inhale 2 puffs into the lungs every 4 (four) hours as needed., Disp: 18 g, Rfl: 0   benzonatate  (TESSALON ) 100 MG capsule, Take 2 capsules (200 mg total) by mouth every 8 (eight) hours., Disp:  21 capsule, Rfl: 0   HYDROcodone -acetaminophen  (NORCO/VICODIN) 5-325 MG tablet, Take 1 tablet by mouth every 4 (four) hours as needed for severe pain (pain score 7-10)., Disp: , Rfl:   No Known Allergies   ROS  As noted in HPI.   Physical Exam  BP 118/81 (BP Location: Left Arm)   Pulse 78   Temp 98.2 F (36.8 C) (Oral)   Resp 18   Wt 125.8 kg   LMP  (LMP Unknown)   SpO2 96%   BMI 40.96 kg/m   Constitutional: Well developed, well nourished, no acute distress Eyes: PERRL, EOMI, conjunctiva normal bilaterally HENT: Normocephalic, atraumatic,mucus membranes moist appearance.  Right TM erythematous, dull, bulging.  Hearing intact and equal bilaterally.  Purulent nasal congestion.  Positive erythematous, swollen turbinates.   Positive maxillary sinus tenderness.  Normal oropharynx.  Positive postnasal drip. Neck: Right sided cervical lymphadenopathy Respiratory: Diffuse wheezing throughout all lung fields.  Positive lateral chest wall tenderness. Cardiovascular: Normal rate and rhythm, no murmurs, no gallops, no rubs GI: nondistended skin: No rash, skin intact Musculoskeletal: no deformities Neurologic: Alert & oriented x 3, CN III-XII grossly intact, no motor deficits, sensation grossly intact Psychiatric: Speech and behavior appropriate   ED Course   Medications  albuterol  (PROVENTIL ) (2.5 MG/3ML) 0.083% nebulizer solution 2.5 mg (2.5 mg Nebulization Given 11/11/24 1913)  ipratropium-albuterol  (DUONEB) 0.5-2.5 (3) MG/3ML nebulizer solution 3 mL (3 mLs Nebulization Given 11/11/24 1913)  AeroChamber Plus Flo-Vu Medium MISC 1 each (1 each Other Given 11/11/24 1914)    Orders Placed This Encounter  Procedures   DG Chest 2 View    Standing Status:   Standing    Number of Occurrences:   1    Reason for Exam (SYMPTOM  OR DIAGNOSIS REQUIRED):   cough 2 qweeks diffuse wheeze r/o pNA   No results found for this or any previous visit (from the past 24 hours). No results found. DG Chest 2 View Result Date: 11/11/2024 CLINICAL DATA:  Cough for 2 weeks.  Wheezing.  Rule out pneumonia. EXAM: CHEST - 2 VIEW COMPARISON:  Radiograph 11/05/2024 FINDINGS: The cardiomediastinal contours are normal. Minimal bronchial thickening. Pulmonary vasculature is normal. No consolidation, pleural effusion, or pneumothorax. No acute osseous abnormalities are seen. IMPRESSION: Minimal bronchial thickening. No pneumonia. Electronically Signed   By: Andrea Gasman M.D.   On: 11/11/2024 19:59    ED Clinical Impression  1. Recurrent acute suppurative otitis media of right ear without spontaneous rupture of tympanic membrane   2. Acute cough   3. Acute non-recurrent maxillary sinusitis   4. Acute bronchitis, unspecified organism       ED Assessment/Plan    Presentation concerning for persistent sinusitis, pneumonia and she has a right-sided otitis media.  Will send home with Omnicef 300 mg p.o. twice daily for 10 days since she has not responded to the Augmentin .  I considered Levaquin, but would prefer to try Omnicef first.  Will check chest x-ray as a baseline.  Will give DuoNeb 5/0.5 mg.  Reviewed imaging independently.  No pneumonia per my read.. See radiology report for full details.  Home with saline nasal irrigation, prednisone 40 mg for 5 days, regularly scheduled albuterol  inhaler with a spacer.  Giving her spacer here since her insurance did not cover it, Mucinex D, saline nasal irrigation, Tylenol  combined with ibuprofen  3-4 times a day as needed.  Reviewed radiology report.  No pneumonia.  Minimal bronchial thickening per radiology consistent with my read.  No change in  management.  On reevaluation, patient states that she feels significantly better.  Diffuse wheezing throughout all lung fields, improved air movement.  Discussedimaging, MDM, treatment plan, and plan for follow-up with patient Discussed sn/sx that should prompt return to the ED. patient agrees with plan.   Meds ordered this encounter  Medications   albuterol  (PROVENTIL ) (2.5 MG/3ML) 0.083% nebulizer solution 2.5 mg   ipratropium-albuterol  (DUONEB) 0.5-2.5 (3) MG/3ML nebulizer solution 3 mL   AeroChamber Plus Flo-Vu Medium MISC 1 each   cefdinir (OMNICEF) 300 MG capsule    Sig: Take 1 capsule (300 mg total) by mouth 2 (two) times daily for 10 days.    Dispense:  20 capsule    Refill:  0   predniSONE (DELTASONE) 20 MG tablet    Sig: Take 2 tablets (40 mg total) by mouth daily with breakfast for 5 days.    Dispense:  10 tablet    Refill:  0      *This clinic note was created using Scientist, clinical (histocompatibility and immunogenetics). Therefore, there may be occasional mistakes despite careful proofreading. ?    Van Knee, MD 11/14/24 1442

## 2024-11-11 NOTE — ED Triage Notes (Signed)
 Pt present sinus pressure with headache and congestion and right ear pain. Symptom started over a week ago.

## 2024-12-19 ENCOUNTER — Inpatient Hospital Stay: Payer: MEDICAID | Attending: Obstetrics and Gynecology

## 2024-12-25 ENCOUNTER — Inpatient Hospital Stay: Payer: MEDICAID

## 2024-12-25 ENCOUNTER — Ambulatory Visit: Payer: MEDICAID

## 2024-12-25 NOTE — Progress Notes (Unsigned)
 No show
